# Patient Record
Sex: Female | Born: 1980 | Hispanic: Yes | Marital: Married | Smoking: Never smoker
Health system: Southern US, Community
[De-identification: ages and names within clinical notes are randomized; demographics above are authoritative.]

## PROBLEM LIST (undated history)

## (undated) ENCOUNTER — Inpatient Hospital Stay (HOSPITAL_COMMUNITY): Payer: Self-pay

## (undated) DIAGNOSIS — Z6791 Unspecified blood type, Rh negative: Secondary | ICD-10-CM

## (undated) DIAGNOSIS — T8040XA Rh incompatibility reaction due to transfusion of blood or blood products, unspecified, initial encounter: Secondary | ICD-10-CM

## (undated) DIAGNOSIS — Z3182 Encounter for Rh incompatibility status: Secondary | ICD-10-CM

## (undated) DIAGNOSIS — IMO0002 Reserved for concepts with insufficient information to code with codable children: Secondary | ICD-10-CM

## (undated) DIAGNOSIS — K116 Mucocele of salivary gland: Secondary | ICD-10-CM

## (undated) DIAGNOSIS — R87619 Unspecified abnormal cytological findings in specimens from cervix uteri: Secondary | ICD-10-CM

## (undated) DIAGNOSIS — O24410 Gestational diabetes mellitus in pregnancy, diet controlled: Secondary | ICD-10-CM

## (undated) HISTORY — DX: Unspecified abnormal cytological findings in specimens from cervix uteri: R87.619

## (undated) HISTORY — DX: Rh incompatibility reaction due to transfusion of blood or blood products, unspecified, initial encounter: T80.40XA

## (undated) HISTORY — DX: Reserved for concepts with insufficient information to code with codable children: IMO0002

## (undated) HISTORY — DX: Unspecified blood type, rh negative: Z67.91

## (undated) HISTORY — DX: Encounter for Rh incompatibility status: Z31.82

## (undated) HISTORY — DX: Gestational diabetes mellitus in pregnancy, diet controlled: O24.410

---

## 2006-01-22 ENCOUNTER — Inpatient Hospital Stay (HOSPITAL_COMMUNITY): Admission: AD | Admit: 2006-01-22 | Discharge: 2006-01-23 | Payer: Self-pay | Admitting: Family Medicine

## 2006-01-25 ENCOUNTER — Ambulatory Visit: Admission: AD | Admit: 2006-01-25 | Discharge: 2006-01-25 | Payer: Self-pay | Admitting: Obstetrics and Gynecology

## 2006-02-21 ENCOUNTER — Ambulatory Visit: Payer: Self-pay | Admitting: Obstetrics & Gynecology

## 2007-03-13 ENCOUNTER — Inpatient Hospital Stay (HOSPITAL_COMMUNITY): Admission: AD | Admit: 2007-03-13 | Discharge: 2007-03-13 | Payer: Self-pay | Admitting: Obstetrics

## 2007-05-26 ENCOUNTER — Inpatient Hospital Stay (HOSPITAL_COMMUNITY): Admission: RE | Admit: 2007-05-26 | Discharge: 2007-05-29 | Payer: Self-pay | Admitting: Obstetrics

## 2007-05-27 ENCOUNTER — Encounter (INDEPENDENT_AMBULATORY_CARE_PROVIDER_SITE_OTHER): Payer: Self-pay | Admitting: Obstetrics

## 2010-01-12 ENCOUNTER — Inpatient Hospital Stay (HOSPITAL_COMMUNITY): Admission: AD | Admit: 2010-01-12 | Discharge: 2010-01-12 | Payer: Self-pay | Admitting: Obstetrics

## 2010-01-30 ENCOUNTER — Ambulatory Visit: Payer: Self-pay | Admitting: Family Medicine

## 2010-01-30 DIAGNOSIS — O039 Complete or unspecified spontaneous abortion without complication: Secondary | ICD-10-CM | POA: Insufficient documentation

## 2010-01-30 DIAGNOSIS — Z6791 Unspecified blood type, Rh negative: Secondary | ICD-10-CM

## 2010-01-30 DIAGNOSIS — O99810 Abnormal glucose complicating pregnancy: Secondary | ICD-10-CM

## 2010-01-30 DIAGNOSIS — O0289 Other abnormal products of conception: Secondary | ICD-10-CM

## 2010-01-30 DIAGNOSIS — O26899 Other specified pregnancy related conditions, unspecified trimester: Secondary | ICD-10-CM

## 2010-01-30 LAB — CONVERTED CEMR LAB: Hgb A1c MFr Bld: 5.5 %

## 2010-01-31 LAB — CONVERTED CEMR LAB
MCHC: 33.4 g/dL (ref 30.0–36.0)
Platelets: 284 10*3/uL (ref 150–400)
RDW: 13.1 % (ref 11.5–15.5)
hCG, Beta Chain, Quant, S: 8.1 milliintl units/mL

## 2010-02-17 ENCOUNTER — Telehealth: Payer: Self-pay | Admitting: Family Medicine

## 2010-02-20 ENCOUNTER — Ambulatory Visit: Payer: Self-pay | Admitting: Family Medicine

## 2010-02-20 DIAGNOSIS — K116 Mucocele of salivary gland: Secondary | ICD-10-CM | POA: Insufficient documentation

## 2010-02-27 ENCOUNTER — Ambulatory Visit: Payer: Self-pay | Admitting: Family Medicine

## 2010-05-21 NOTE — L&D Delivery Note (Signed)
Delivery Note At 10:03 2AM a viable and healthy female was delivered via Vaginal, Spontaneous Delivery (Presentation: Right Occiput Anterior).  APGAR: 9, 9; weight 7 lb 4.4 oz (3300 g).   Placenta status: Intact, Spontaneous.  Cord: 3 vessels with the following complications: None.   Anesthesia: None  Episiotomy: None Lacerations: 2nd degree;Perineal Suture Repair: 3.0 vicryl Est. Blood Loss (mL): 300  Mom to postpartum.  Baby to nursery-stable.  Aayat Hajjar 12/24/2010, 10:45 AM

## 2010-05-29 ENCOUNTER — Encounter: Payer: Self-pay | Admitting: Family Medicine

## 2010-05-29 ENCOUNTER — Ambulatory Visit
Admission: RE | Admit: 2010-05-29 | Discharge: 2010-05-29 | Payer: Self-pay | Source: Home / Self Care | Attending: Family Medicine | Admitting: Family Medicine

## 2010-05-29 LAB — CONVERTED CEMR LAB
Beta hcg, urine, semiquantitative: POSITIVE
hCG, Beta Chain, Quant, S: 150696.2 milliintl units/mL

## 2010-05-31 ENCOUNTER — Encounter: Payer: Self-pay | Admitting: Family Medicine

## 2010-05-31 ENCOUNTER — Ambulatory Visit: Admission: RE | Admit: 2010-05-31 | Discharge: 2010-05-31 | Payer: Self-pay | Source: Home / Self Care

## 2010-06-01 ENCOUNTER — Telehealth: Payer: Self-pay | Admitting: Family Medicine

## 2010-06-06 ENCOUNTER — Encounter: Payer: Self-pay | Admitting: Family Medicine

## 2010-06-06 ENCOUNTER — Ambulatory Visit (HOSPITAL_COMMUNITY)
Admission: RE | Admit: 2010-06-06 | Discharge: 2010-06-06 | Payer: Self-pay | Source: Home / Self Care | Attending: Family Medicine | Admitting: Family Medicine

## 2010-06-12 ENCOUNTER — Ambulatory Visit: Admit: 2010-06-12 | Payer: Self-pay

## 2010-06-12 ENCOUNTER — Telehealth: Payer: Self-pay | Admitting: Family Medicine

## 2010-06-20 NOTE — Assessment & Plan Note (Signed)
Summary: fu for cirg/ mj   Vital Signs:  Patient profile:   30 year old female Weight:      140 pounds Pulse rate:   78 / minute BP sitting:   130 / 80  (right arm)  Vitals Entered By: Arlyss Repress CMA, (February 27, 2010 2:40 PM) CC: f/up last OV and flu shot Is Patient Diabetic? No Pain Assessment Patient in pain? no        Primary Care Atavia Poppe:  Zachery Dauer MD  CC:  f/up last OV and flu shot.  History of Present Illness: 1. F/U lip cyst:  Pt returns to clinic today to follow up on her lip cyst that was incised last week.  She reports that it is doing much better.  She doesn't even notice the bump anymore.  It hasn't been bleeding.  It is not painful.  She is able to eat and drink without difficulty.  ROS: denies fevers  Habits & Providers  Alcohol-Tobacco-Diet     Tobacco Status: never  Current Medications (verified): 1)  Gnp Prenatal Vitamins 28-0.8 Mg Tabs (Prenatal Vit-Fe Fumarate-Fa) .... One By Mouth Daily  Allergies: No Known Drug Allergies  Physical Exam  General:  vitals reviewed. no acute distress Mouth:  mucocele cyst on the lower right part of the lip s/p incision is healing well.  No surrounding area of erythema.  No bleeding.  Not painful.   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  Healing appropriately.  No further follow up needed.  Orders: [facility]- Est Level  2 (76734)  Complete Medication List: 1)  Gnp Prenatal Vitamins 28-0.8 Mg Tabs (Prenatal vit-fe fumarate-fa) .... One by mouth daily  Other Orders: Influenza Vaccine NON MCR ([id number])   Influenza Vaccine    Vaccine Type: Fluvax Non-MCR    Site: right deltoid    Mfr: GlaxoSmithKline    Dose: 0.5 ml    Route: IM    Given by: Arlyss Repress CMA,    Exp. Date: 11/15/2010    Lot #: [id number]    VIS given: 12/13/09 version given February 27, 2010.  Flu Vaccine Consent Questions    Do you have a history of  severe allergic reactions to this vaccine? no    Any prior history of allergic reactions to egg and/or gelatin? no    Do you have a sensitivity to the preservative Thimersol? no    Do you have a past history of Guillan-Barre Syndrome? no    Do you currently have an acute febrile illness? no    Have you ever had a severe reaction to latex? no    Vaccine information given and explained to patient? yes    Are you currently pregnant? no

## 2010-06-20 NOTE — Miscellaneous (Signed)
Summary: ROI  ROI   Imported By: Clydell Hakim 02/01/2010 16:03:47  _____________________________________________________________________  External Attachment:    Type:   Image     Comment:   External Document

## 2010-06-20 NOTE — Assessment & Plan Note (Signed)
Summary: ball under her lip/mj   Vital Signs:  Patient profile:   30 year old female Height:      62.75 inches Weight:      142 pounds BMI:     25.45 Temp:     98.4 degrees F oral Pulse rate:   74 / minute BP sitting:   123 / 83  (right arm) Cuff size:   regular  Vitals Entered By: Tessie Fass CMA (February 20, 2010 4:44 PM)  Primary Care Provider:  Zachery Dauer MD   History of Present Illness: 1. Bump on her lip:  Pt presents with a bump on her lower lip on the inside of her lip.  It has been there for about 5 months.  It is about 1 cm in diameter.  It is not really getting bigger but is now more bothersome to her.  It does bother her when she is eating.  It has not been draining or bleeding.  ROS: denies fevers   Procedure note: -Informed consent was obtained after explaining the risks and benefits - Area was numbed with Lidocaine with Epi - Cyst was excised with 15 blade - Monsel's solution was applied for bleeding - Liquid Nitrogen was applied x2 with a 2mm freeze area extension - Pt tolerated the procedure well - Minimal blood loss - No complications  Allergies: No Known Drug Allergies  Physical Exam  General:  vitals reviewed. no acute distress Mouth:  1 cm mucocele cyst on the lower right part of the lip.   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    Orders: Provider Misc Charge- [facility] (Misc)  Complete Medication List: 1)  Gnp Prenatal Vitamins 28-0.8 Mg Tabs (Prenatal vit-fe fumarate-fa) .... One by mouth daily  Patient Instructions: 1)  Si esta  sangrando, aplique presion directamente en el labio. 2)  Coma comida blando y no picante. 3)  Va a tener hinchason del labio. Si se pone mas doloroso venga antes a la clinica.  4)  Despues de pocos dias, la costra va a caer y el labio va a sanar de adentro hacia afuero.  5)   Regrese en una semana para chequeo.  6)  Return in one week.   Procedure Note  Cyst Removal: The patient complains of irritation and swelling. Onset of lesion: 3 months Indication: inflamed lesion Consent signed: yes  Procedure #1: elliptical incision and removal w/blunt dissection    Size (in cm): 0.6 x 0.6    Region: inferior    Location: lip    Comment: base of mucocele frozen with liquid nitrogen after application of Monsel's solution    Instrument used: #15 blade    Anesthesia: 2.0 ml 1% lidocaine w/epinephrine

## 2010-06-20 NOTE — Miscellaneous (Signed)
Summary: Procedures consent  Procedures consent   Imported By: De Nurse 02/23/2010 16:22:22  _____________________________________________________________________  External Attachment:    Type:   Image     Comment:   External Document

## 2010-06-20 NOTE — Progress Notes (Signed)
Summary: phnmsg  Phone Note Call from Patient Call back at [phone]   Caller: Patient Reason for Call: Privacy/Consent Authorization Summary of Call: wants to know if her husband can become NP Roxan Hockey Initial call taken by: De Nurse,  February 17, 2010 1:32 PM  Follow-up for Phone Call        Yes, he's an immediate family member Follow-up by: Zachery Dauer MD,  February 17, 2010 5:17 PM

## 2010-06-20 NOTE — Assessment & Plan Note (Signed)
Summary: NEW PT/[facility]   Vital Signs:  Patient profile:   30 year old female Height:      62.75 inches Weight:      139.2 pounds BMI:     24.94 Temp:     98.7 degrees F oral Pulse rate:   77 / minute Pulse rhythm:   regular BP sitting:   109 / 75  (left arm) Cuff size:   regular  Vitals Entered By: Loralee Pacas CMA (January 30, 2010 2:09 PM) CC: NP   Primary Care Provider:  Zachery Dauer MD  CC:  NP.  History of Present Illness: 30 yo [id number] here for NP visit. Interpretor used.  1. Blighted Ovum: Reviewed Echart and discussed at length with patient. 01/12/10, patient found to have blighted ovum at Dr. Elsie Stain (OB/GYN) office. She was Rx Misoprostol and Percocet. She was also instructed to go to the [facility] for Rx Rophylac as patient is known RH negative - documented as given. The patient states that she had contractions and then "a lot, a lot" of pain and passage of blood/tissue about 6 hours later, associated with a feeling of "almost passing out." She has not had a normal menses since then, but does endorses "spotting" - last time 2 days ago - brownish discharge. She denies fever/chills, N/V/D, HA, dizziness, abdominal pain, vaginal pain, vaginal itch/odor, dysuria, fatigue. She has not been sexually active with her husband since the episode. She was told to f/u with Dr. Gaynell Face for evaluation first. She decided to come here first. She does want to concieve in the near future.  Habits & Providers  Alcohol-Tobacco-Diet     Tobacco Status: never  Current Medications (verified): 1)  Gnp Prenatal Vitamins 28-0.8 Mg Tabs (Prenatal Vit-Fe Fumarate-Fa) .... One By Mouth Daily  Allergies (verified): No Known Drug Allergies  Past History:  Past Medical History: [id number]     - Hx of blighted ovum (01/12/10)    - Awaiting records from Dr. 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  Social History: Lives in a house in [place] with Husband (Roxan Hockey - DOB 03/16/80) and Son [facility] Vasquez-Leon - DOB 05/27/07). Not employed. Denies tobacco, ETOH, drugs. + exercise.Smoking Status:  never  Review of Systems General:  Denies chills, fatigue, and fever. GI:  Denies abdominal pain, diarrhea, nausea, and vomiting. GU:  Complains of abnormal vaginal bleeding and discharge; denies dysuria and genital sores. Derm:  Denies rash.  Physical Exam  General:  Well-developed, well-nourished, in no acute distress; alert, appropriate and cooperative throughout examination. Vitals reviewed. Lungs:  Normal respiratory effort, chest expands symmetrically. Lungs are clear to auscultation, no crackles or wheezes. Heart:  Normal rate and regular rhythm. S1 and S2 normal without gallop, murmur, click, rub or other extra sounds. Abdomen:  Bowel sounds positive,abdomen soft and non-tender without masses, organomegaly or hernias noted. Genitalia:  Pelvic Exam:        External: normal female genitalia without lesions or masses        Vagina: normal without lesions or masses        Cervix: dilated 1-2 cm, with brown mucus discharge, no signs of infection or inflammation Psych:  Oriented X3, memory intact for recent and remote, normally interactive, good eye contact, not anxious appearing, and not depressed appearing.  Impression & Recommendations:  Problem # 1:  OTHER ABNORMAL PRODUCT OF CONCEPTION (ICD-631) Assessment New Patient with recent (and endorsed previous episode of) blighted ovum. Hx is unclear. Will request records from Dr. Gaynell Face, OB/GYN.  Orders: CBC-[facility] (30865)  Problem # 2:  ABORTION, COMPLETE (HQI-696.29) Assessment: New s/p Misoprostol and Rhophylac 01/12/10. No s/s of infection or complications on H/P. Will check serum quant. Will also check CBC to  evaluate Hgb and WBC. Advised patient to continue to use condoms for contraception for  ~ 4-6 months as previously recommended by Dr. Gaynell Face. She does wish to try to concieve shortly after. Advised to continue PNV.  Orders: B-HCG Quant-[facility] ([id number])  Problem # 3:  GESTATIONAL DIABETES (ICD-648.80) Assessment: New Will previous pregnancy - diet controlled. Patient also with strong FamHx. Will evaluate with A1c today. Patient endorses maintaining a DM friendly diet. She is at a healthy weight. 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  Complete Medication List: 1)  Gnp Prenatal Vitamins 28-0.8 Mg Tabs (Prenatal vit-fe fumarate-fa) .... One by mouth daily  Patient Instructions: 1)  It was nice to meet you today.  2)  We will call you with the results of your lab tests.  Laboratory Results   Blood Tests   Date/Time Received: January 30, 2010 2:54 PM  Date/Time Reported: January 30, 2010 3:39 PM   HGBA1C: 5.5%   (Normal Range: Non-Diabetic - 3-6%   Control Diabetic - 6-8%)  Comments: ...........test performed by............Marland KitchenDewitt Hoes, [place](ASCP)3:39 PM entered by Terese Door, CMA       Prevention & Chronic Care Immunizations   Influenza vaccine: Not documented   Influenza vaccine deferral: Deferred  (01/30/2010)    Tetanus booster: Not documented    Pneumococcal vaccine: Not documented  Other Screening   Pap smear: Not documented   Pap smear action/deferral: Not indicated-other  (01/30/2010)   Pap smear due: 07/20/2010   Smoking status: never  (01/30/2010)

## 2010-06-22 NOTE — Assessment & Plan Note (Signed)
Summary: F/U BOIL TO LIP/KEEPS COMING BACK/[facility]   Vital Signs:  Patient profile:   30 year old female Height:      62.75 inches Weight:      144 pounds BMI:     25.81 Temp:     98.5 degrees F oral Pulse rate:   80 / minute BP sitting:   121 / 80  (right arm)  Vitals Entered By: Arlyss Repress CMA, (May 29, 2010 1:42 PM) CC: f/up lip cyst; also wants pregnancy test Is Patient Diabetic? No Pain Assessment Patient in pain? no        Primary Care Provider:  Zachery Dauer MD  CC:  f/up lip cyst; also wants pregnancy test.  History of Present Illness: Conducted with help of interpreter   1) Mucocele: History of mucocele treated in October 2011 with/ excision. Returned about 2 weeks ago. Comes and goes. Irritating. Worse when eats meat. Sometimes drains small amount of clear fluid.   2) Pregnancy?: LMP 03/22/10. Positive pregnancy test (urine) here. Now [id number] at 9 weeks 5 days based on LMP. Reports breast tenderness, nausea in the morning. Patient w/ blighted ovum at Dr. Elsie Stain (OB/GYN) office in July 2011. She was Rx Misoprostol and Percocet. She was also instructed to go to the [facility] for Rx Rophylac as patient is known RH negative - documented as given.   Denies fever/chills, N/V/D, HA, dizziness, abdominal pain, vaginal pain, vaginal itch/odor, dysuria, fatigue, contractions, bleeding.   Habits & Providers  Alcohol-Tobacco-Diet     Tobacco Status: never  Current Medications (verified): 1)  Gnp Prenatal Vitamins 28-0.8 Mg Tabs (Prenatal Vit-Fe Fumarate-Fa) .... One By Mouth Daily  Allergies (verified): No Known Drug Allergies  Physical Exam  General:  vitals reviewed. no acute distress Mouth:  Slightly raised mucocele cyst right lower lip.  No surrounding area of erythema.  No bleeding.  Not painful. Lungs:  Normal respiratory effort, chest expands symmetrically. Lungs are clear to auscultation, no crackles or wheezes. Heart:  Normal rate and regular rhythm. S1 and S2  normal without gallop, murmur, click, rub or other extra sounds. Abdomen:  unable to Doppler fetal heart tones    Impression & Recommendations:  Problem # 1:  PREGNANCY WITH HISTORY OF TROPHOBLASTIC DISEASE (ICD-V23.1) Assessment New History of blighted ovum. Will check serum Hcg today and two days from now - would expect doubling at this point in pregnancy. Unable to hear Doppler tones today, however patient only 9 w 5 d by LMP. If normal IUP would need to set up as new OB visit - would need proof of pregnancy letter and need to be set up with [facility] prior to scheduling for new OB visit and new OB labs and ultrasound. If blighted ovum would need to discuss potential plans for management (including expectant management vs medical termination vs D+C) with patient at that time.    Orders: B-HCG Quant-[facility] ([id number]) [facility]- Est  Level 4 (99214)Future Orders: B-HCG Quant-[facility] ([id number]) ... 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 Will have patient follow up if needs further treatment. Instructions for aftercare given. Orders: Cryo (1st lesion) benign - [facility] (17000)  Complete Medication List: 1)  Gnp Prenatal Vitamins 28-0.8 Mg Tabs (Prenatal vit-fe fumarate-fa) .... One by mouth daily  Other Orders: U Preg-[facility] ([id number])  Patient Instructions: 1)  [employer]  por la tarde) para chequear la prueba de sangre de [place]. 2)  Toma las pastillas prenatales cada dia    Orders Added: 1)  U Preg-[facility] [[id number]] 2)  B-HCG Quant-[facility] [[id number]] 3)  B-HCG Quant-[facility] [[id number]] 4)  [facility]- Est  Level 4 [99214] 5)  Cryo (1st lesion) benign - [facility] [[id number]]    Laboratory Results   Urine Tests  Date/Time Received: May 29, 2010 2:02 PM  Date/Time Reported: May 29, 2010 2:06 PM     Urine HCG:  positive Comments: ...............test performed by......Marland KitchenBonnie A. [place], MLS (ASCP)cm       Procedure Note  Cyst Removal: Onset of lesion: 2 weeks ago  Indication: inflamed lesion  Procedure #1: Cryotherapy w/ liquid nitrogen    Size (in cm): 0.7 x 0.5    Region: lower right     Location: lip     Comment: mucocele

## 2010-06-22 NOTE — Progress Notes (Signed)
Summary: lab  Phone Note Call from Patient   Caller: Patient Summary of Call: Pt called and ask for lab result. Pt wants dr.Anely Spiewak call her back and check with pt if every thing is ok. Initial call taken by: Marines Jean Rosenthal,  June 01, 2010 12:18 PM  Follow-up for Phone Call        Reviewed labs with Dr. Mauricio Po - beta hCG levels decreased over two day interval - patient needs to have transvaginal ultrasound to assess that she has a viable intrauterine pregnancy - order placed. Please call patient to let her know this.  Follow-up by: Bobby Rumpf  MD,  June 02, 2010 8:33 AM    I called pt and let her know HCG levels dicreased and pt has [place] appt at [facility] on 06/06/10 @ 11:00am  [employer]

## 2010-06-22 NOTE — Miscellaneous (Signed)
Summary: patient requests results of ultrasound  Clinical Lists Changes patient came by office wanting to get results of ultrasound. advised we will send message to Dr. Wallene Huh and will call her back. cell # [phone] Theresia Lo RN  June 06, 2010 1:58 PM  Reviewed u/s - shows single IUP at 10 weeks 1 day. Relayed message to RN to call patient with results.  Bobby Rumpf  MD  June 06, 2010 2:02 PM    Marines called patient today and she has questions about ultrasound still. appointment scheduled with Dr. [facility]  06/12/2010  for patient to discuss. Theresia Lo RN  June 07, 2010 2:31 PM     I called patient back; call completed in Spanish.  Call to cell [phone], no answer.  Left message. Called and spoke with her on [phone] and spoke with patient. She is concerned because she had gotten a call that her blood tests were abnormal.  I discussed with her the reason for our concern about the hCG levels, reassurance about the findings of the [place] which shows viable IUP just beyond 10 weeks' gestation.  Patient is to keep her appt with PCP scheduled for next week.  She reports feeling well, no active concerns, no additional questions.  Paula Compton MD  June 09, 2010 2:33 PM

## 2010-06-22 NOTE — Progress Notes (Signed)
  Phone Note Outgoing Call   Call placed by: Paula Compton MD,  June 12, 2010 1:56 PM Call placed to: Patient Summary of Call: [phone] reached patient.  Conversation in [place].  She is told not to come in to appt scheduled for today, as it was simply to communicate the results of the OB US that shows viable IUP.  She agrees and voices understanding.  Initial call taken by: Paula Compton MD,  June 12, 2010 2:01 PM

## 2010-06-27 ENCOUNTER — Encounter: Payer: Self-pay | Admitting: Family Medicine

## 2010-06-28 ENCOUNTER — Other Ambulatory Visit: Payer: Self-pay

## 2010-06-28 ENCOUNTER — Other Ambulatory Visit: Payer: Self-pay | Admitting: Family Medicine

## 2010-06-28 DIAGNOSIS — Z3480 Encounter for supervision of other normal pregnancy, unspecified trimester: Secondary | ICD-10-CM

## 2010-06-29 LAB — SICKLE CELL SCREEN: Sickle Cell Screen: NEGATIVE

## 2010-06-29 LAB — HIV ANTIBODY (ROUTINE TESTING W REFLEX): HIV: NONREACTIVE

## 2010-06-30 LAB — OBSTETRIC PANEL
Basophils Relative: 0 % (ref 0–1)
Eosinophils Absolute: 0 10*3/uL (ref 0.0–0.7)
Eosinophils Relative: 1 % (ref 0–5)
Hepatitis B Surface Ag: NEGATIVE
Lymphs Abs: 1.8 10*3/uL (ref 0.7–4.0)
MCH: 28.7 pg (ref 26.0–34.0)
MCHC: 33.4 g/dL (ref 30.0–36.0)
MCV: 86 fL (ref 78.0–100.0)
Neutrophils Relative %: 71 % (ref 43–77)
Platelets: 256 10*3/uL (ref 150–400)
RBC: 4.49 MIL/uL (ref 3.87–5.11)
Rh Type: NEGATIVE

## 2010-06-30 LAB — URINE CULTURE: Colony Count: NO GROWTH

## 2010-07-05 ENCOUNTER — Ambulatory Visit (INDEPENDENT_AMBULATORY_CARE_PROVIDER_SITE_OTHER): Payer: Self-pay | Admitting: Family Medicine

## 2010-07-05 ENCOUNTER — Other Ambulatory Visit: Payer: Self-pay | Admitting: Family Medicine

## 2010-07-05 ENCOUNTER — Encounter: Payer: Self-pay | Admitting: Family Medicine

## 2010-07-05 DIAGNOSIS — IMO0002 Reserved for concepts with insufficient information to code with codable children: Secondary | ICD-10-CM

## 2010-07-05 DIAGNOSIS — O24419 Gestational diabetes mellitus in pregnancy, unspecified control: Secondary | ICD-10-CM

## 2010-07-05 DIAGNOSIS — N898 Other specified noninflammatory disorders of vagina: Secondary | ICD-10-CM

## 2010-07-05 DIAGNOSIS — Z331 Pregnant state, incidental: Secondary | ICD-10-CM

## 2010-07-05 DIAGNOSIS — O98919 Unspecified maternal infectious and parasitic disease complicating pregnancy, unspecified trimester: Secondary | ICD-10-CM

## 2010-07-05 DIAGNOSIS — O99810 Abnormal glucose complicating pregnancy: Secondary | ICD-10-CM

## 2010-07-05 LAB — POCT WET PREP (WET MOUNT)
Clue Cells Wet Prep HPF POC: NEGATIVE
Trichomonas Wet Prep HPF POC: NEGATIVE
Yeast Wet Prep HPF POC: NEGATIVE

## 2010-07-05 NOTE — Progress Notes (Deleted)
  Subjective:    Patient ID: Maria [name], female    DOB: [birth year], 30 y.o.   MRN: [id number]  HPI    Review of Systems     Objective:   Physical Exam  Constitutional: She appears well-developed and well-nourished.          Assessment & Plan:

## 2010-07-05 NOTE — Progress Notes (Deleted)
  Subjective:    Maria [name] is being seen today for her first obstetrical visit.  This is a planned pregnancy. She is at [redacted]w[redacted]d gestation. Her obstetrical history is significant for history of diet controlled gestational diabetes and RH negativity s/p Rhogam. Relationship with FOB: spouse, living together. Patient does intend to breast feed. Pregnancy history fully reviewed. Pt reports history of 1st trimester loss x 2 in the past(See OB history for full details). Pt states that she was given rhogam for treatment.    Patient reports no complaints.    Objective:     BP 120/70  Temp 98.1 F (36.7 C)  Wt 140 lb 11.2 oz (63.821 kg)  LMP 03/22/2010  Breastfeeding? Unknown Physical Exam  Constitutional: She is oriented to person, place, and time. She appears well-developed and well-nourished.  HENT:  Head: Normocephalic and atraumatic.  Eyes: Conjunctivae and EOM are normal. Pupils are equal, round, and reactive to light.  Neck: Normal range of motion. Neck supple.  Cardiovascular: Normal rate and regular rhythm.   Respiratory: Effort normal and breath sounds normal.  GI: Soft. Bowel sounds are normal.       Fetal heart rate in the 150s  Genitourinary: Vagina normal. No breast swelling or discharge. No erythema around the vagina.       Normal external genitalia Introitis normal Vaginal mucosa normal  Mild discharge  Musculoskeletal: Normal range of motion.  Neurological: She is alert and oriented to person, place, and time.  Skin: Skin is warm.  Psychiatric: She has a normal mood and affect. Her behavior is normal.    Maternal Exam:  Abdomen: Patient reports no abdominal tenderness. Surgical scars: low transverse.   Introitus: Normal vulva. Normal vagina.       Assessment:    Pregnancy: [id number] Patient Active Problem List  Diagnoses  . MUCOCELE, SALIVARY GLAND  . OTHER ABNORMAL PRODUCT OF CONCEPTION  . ABORTION, COMPLETE  . GESTATIONAL DIABETES  . RH FACTOR, NEGATIVE        Plan:     Initial labs drawn prior to visit.  Prenatal vitamins Early 1 hr gtt today--> 1 hr GTT 253; Will refer to high risk OB for management of gestational DM GC/Chl today  OB urine culture today Will re-attempt to get records from Dr. Elsie Stain office in terms of complete OB/Gyn/medical history.  Problem list reviewed and updated. AFP3 discussed: declined. Role of ultrasound in pregnancy discussed; fetal survey: requested. Amniocentesis discussed: declined. Follow up in 4 weeks.   NEWTON,STEVEN 07/05/2010

## 2010-07-05 NOTE — Progress Notes (Signed)
  Subjective:    Patient ID: Maria [name], female    DOB: [birth year], 30 y.o.   MRN: [id number]  HPI    Review of Systems     Objective:   Physical Exam        Assessment & Plan:

## 2010-07-05 NOTE — Patient Instructions (Addendum)
Embarazo - Segundo trimestre (Pregnancy - Second Trimester)   El segundo trimestre del Psychiatrist (del 3 al ) es un perodo de evolucin rpida para usted y el beb. Hacia el final del sexto mes, el beb mide aproximadamente 23 cm y pesa 680 g. Comenzar a Pharmacologist del beb [place] 18 y las 20 [address] de [place]. Podr sentir las pataditas ("quickening en ingls"). Hay un rpido [employer]. Puede segregar un lquido claro Charity fundraiser) de las [place]. Quizs sienta pequeas contracciones en el vientre (tero) Esto se conoce  como falso trabajo de parto o contracciones de Braxton-Hicks. Es como una prctica del trabajo de parto que se produce cuando el beb est listo para salir. Generalmente los problemas de vmitos matinales ya se han superado hacia el final del Medical sales representative. Algunas mujeres desarrollan pequeas manchas oscuras (que se denominan cloasma, mscara del embarazo) en la cara que normalmente se van luego del nacimiento del beb. La exposicin al sol empeora las manchas. Puede desarrollarse acn en algunas mujeres embarazadas, y puede desaparecer en aquellas que ya tienen acn.   EXAMENES PRENATALES  Durante los [employer], deber seguir realizando pruebas de [place], segn avance el [place]. Estas pruebas se realizan para controlar su salud y la del beb. Tambin se realizan anlisis de sangre para [employer] niveles de [place]. La anemia (bajo nivel de hemoglobina) es frecuente durante el embarazo. Para prevenirla, se administran hierro y vitaminas. Tambin se le realizarn exmenes para saber si tiene diabetes entre las 24 y las 28 semanas del [place]. Podrn repetirle algunas de las [employer] hicieron previamente.  En cada visita le medirn el tamao del tero. Esto se realiza para asegurarse de que el beb est creciendo correctamente de acuerdo al estado del [place].   Tambin en cada visita prenatal controlarn su presin arterial. Esto se  realiza para asegurarse de que no tenga toxemia.  Se controlar su orina para asegurarse de que no tenga infecciones, diabetes o protena en la orina.  Se controlar su peso regularmente para asegurarse que el aumento ocurre al ritmo indicado. Esto se hace para asegurarse que usted y el beb tienen una evolucin normal.  En algunas ocasiones se realiza una prueba de ultrasonido para confirmar el correcto desarrollo y evolucin del beb. Esta prueba se realiza con ondas sonoras inofensivas para el beb, de modo que el profesional pueda calcular ms precisamente la fecha del [place].   Algunas veces se realizan pruebas especializadas del lquido amnitico que rodea al beb. Esta prueba se denomina amniocentesis. El lquido amnitico se obtiene introduciendo una aguja en el vientre (abdomen). Se realiza para Conservator, museum/gallery en los que existe alguna preocupacin acerca de algn problema gentico que pueda sufrir el beb. En ocasiones se lleva a cabo cerca del final del embarazo, si es necesario inducir al [employer]. En este caso se realiza para asegurarse que los pulmones del beb estn lo suficientemente maduros como para que pueda vivir fuera del tero.   CAMBIOS QUE OCURREN EN EL SEGUNDO TRIMESTRE DEL EMBARAZO Su organismo atravesar numerosos cambios durante el [employer].  Estos pueden variar de Neomia Dear persona a otra. Converse con el profesional que la asiste acerca los cambios que usted note y que la preocupen.  Durante el segundo trimestre probablemente sienta un aumento del apetito. Es normal tener "antojos" de Development worker, community. Esto vara de Neomia Dear persona a otra y de un embarazo a Therapist, art.   El abdomen inferior comenzar a abultarse.  Podr tener la necesidad de Geographical information systems officer con ms frecuencia debido a que el tero y el beb presionan sobre la vejiga. Tambin es frecuente contraer ms infecciones urinarias durante el embarazo (dolor al [employer]oco[employer]illips). Puede evitarlas bebiendo gran cantidad de  lquidos y vaciando la vejiga antes y despus de Sales promotion account executive.   Podrn aparecer las primeras estras en las caderas, abdomen y [place]. Estos son cambios normales del cuerpo durante el [place]. No existen medicamentos ni ejercicios que puedan prevenir [employer].  Es posible que comience a desarrollar venas inflamadas y abultadas (varices) en las piernas. El uso de medias de descanso, Optometrist sus pies durante 15 minutos, 3 a 4 veces al da y Film/video editor la sal en su dieta ayuda a Journalist, newspaper.  Podr sentir Engineering geologist gstrica a medida que el tero crece y Doctor, general practice. Puede tomar anticidos, con la autorizacin de su mdico, para Financial planner. Tambin es til ingerir pequeas comidas 4 a 5 veces al Futures trader.  La constipacin puede tratarse con un laxante o agregando fibra a su dieta. Beber grandes cantidades de lquidos, comer vegetales, frutas y granos integrales es de [place].  Tambin es beneficioso practicar actividad fsica. Si ha sido una persona Engineer, mining, podr continuar con la [employer]son de las actividades durante el mismo. Si ha sido [employer], puede ser beneficioso que comience con un programa de ejercicios, Museum/gallery exhibitions officer.  Puede desarrollar hemorroides (vrices en el recto) hacia el final del segundo trimestre. Tomar baos de asiento tibios y Chemical engineer cremas recomendadas por el profesional que lo asiste sern de ayuda para los problemas de hemorroides.  Tambin podr Financial risk analyst de espalda durante este momento de su embarazo. Evite levantar objetos pesados, utilice zapatos de taco bajo y [place] buena postura para ayudar a reducir los problemas de [place].  Algunas mujeres embarazadas desarrollan hormigueo y adormecimiento de la mano y los dedos debido a la hinchazn y compresin de los ligamentos de la mueca (sndrome del tnel carpiano). Esto desaparece una vez que el beb nace.  Como sus pechos se agrandan,  Pension scheme manager un sujetador ms grande. Use un sostn de soporte, cmodo y de algodn. No utilice un sostn para amamantar hasta el ltimo mes de embarazo si va a amamantar al beb.  Podr observar una lnea oscura desde el ombligo hacia la zona pbica denominada linea nigra.  Podr observar que sus mejillas se ponen coloradas debido al aumento de flujo sanguneo en la cara.  Podr desarrollar "araitas" en la cara, cuello y pecho. Esto desaparece una vez que el beb nace.   INSTRUCCIONES PARA EL CUIDADO DOMICILIARIO  Es extremadamente importante que evite el cigarrillo, hierbas medicinales, alcohol y las drogas no prescriptas durante el Psychiatrist. Estas sustancias qumicas afectan la formacin y el desarrollo del beb. Evite estas sustancias durante todo el embarazo para asegurar el nacimiento de un beb sano.  La mayor parte de los cuidados que se aconsejan son los mismos que los indicados para Financial risk analyst trimestre del Psychiatrist. Cumpla con las citas tal como se le indic. Siga las instrucciones del profesional que lo asiste con respecto al uso de los medicamentos, el ejercicio y Psychologist, forensic.  Durante el embarazo debe obtener nutrientes para usted y para su beb. Consuma alimentos balanceados a intervalos regulares. Elija alimentos como carne, pescado, [place] y otros productos lcteos descremados, vegetales, frutas, panes integrales y cereales. El Equities trader cul es el aumento de peso ideal.  Las relaciones sexuales  fsicas pueden continuarse hasta cerca del fin del embarazo si no existen otros problemas. Estos problemas pueden ser la prdida temprana (prematura) de lquido amnitico de las [place], sangrado vaginal, dolor abdominal u otros problemas mdicos o del Psychiatrist.  Realice Tes[employer], si no tiene restricciones. Consulte con el profesional que la asiste si no sabe con certeza si determinados ejercicios son seguros. El mayor aumento de peso tiene Environmental consultant durante  los ltimos 2 trimestres del Psychiatrist. El ejercicio la ayudar a: l Controlar su peso.  l Ponerla en forma para el parto.  l Ayudarla a perder peso luego de haber dado a luz.  Use un buen sostn o como los que se usan para hacer deportes para Paramedic la sensibilidad de las [place]. Tambin puede serle til si lo [place] mientras duerme. Si pierde Product manager, podr [employer]in.  No utilice la baera con agua caliente, baos turcos y saunas durante el [address]  Utilice el cinturn de seguridad sin excepcin cuando conduzca. Este la proteger a usted y al beb en caso de accidente.  Evite comer carne cruda, queso crudo, y el contacto con los utensilios y desperdicios de los gatos. Estos elementos contienen grmenes que pueden causar defectos de nacimiento en el beb.  El segundo trimestre es un buen momento para visitar a su dentista y Software engineer si an no lo ha hecho. Es Primary school teacher los dientes limpios. Utilice un cepillo de dientes blando. Cepllese ms suavemente durante el embarazo.  Es ms fcil perder algo de orina durante el [place]. Apretar y Chief Operating Officer los msculos de la pelvis la ayudar con este problema. Practique detener la miccin cuando est en el bao. Estos son los mismos msculos que Development worker, international aid. Son [employer] mismos msculos que utiliza cuando trata de [employer] gases. Puede practicar apretando estos msculos 10 veces, y repetir esto 3 veces por da aproximadamente. Una vez que conozca qu msculos debe apretar, no realice estos ejercicios durante la miccin. Puede favorecerle una infeccin si la orina vuelve hacia atrs.  Pida ayuda si tiene necesidades econmicas, de asesoramiento o nutricionales durante el [place]. El profesional podr ayudarla con respecto a estas necesidades, o derivarla a otros especialistas.  La piel puede ponerse grasa. Si esto sucede, lvese la cara con un jabn [place], utilice un humectante no graso y  [place] con base de aceite o crema.   CONSUMO DE MEDICAMENTOS Y DROGAS DURANTE EL EMBARAZO  Contine tomando las vitaminas apropiadas para esta etapa tal como se le indic. Las vitaminas deben contener un miligramo de cido flico y deben suplementarse con hierro. Guarde todas las vitaminas fuera del alcance de los nios. La ingestin de slo un par de vitaminas o tabletas que contengan hierro puede ocasionar la [employer] en un beb o en un nio pequeo.  Evite el uso de [place], inclusive los de venta libre y hierbas que no hayan sido prescriptos o indicados por el profesional que la asiste. Algunos medicamentos pueden causar problemas fsicos al beb. Utilice los medicamentos de venta libre o de prescripcin para Chief Technology Officer, Environmental health practitioner o la [place], segn se lo indique el profesional que lo asiste. No utilice aspirina, ibuprofeno (Motrin, Advil, Nuprin) o naproxeno (Aleve) a menos que el profesional la autorice.  El consumo de alcohol est relacionado con ciertos defectos de nacimiento. Esto incluye el sndrome de alcoholismo fetal. Debe evitar el consumo de alcohol en cualquiera de sus formas. El cigarrillo causa nacimientos prematuros y bebs de  bajo peso. El uso de drogas recreativas est absolutamente prohibido. Son muy nocivas para el beb. Un beb que nace de [employer], ser adicto al nacer. Ese beb tendr los mismos sntomas de abstinencia que un adulto.  Infrmele al profesional si consume alguna droga.  No consuma drogas ilegales. Pueden causarle mucho dao al beb.   SOLICITE ATENCIN MDICA SI:  Tiene preguntas o preocupaciones durante su embarazo. Es mejor que llame para Science writer las dudas que esperar hasta su prxima visita prenatal. Thressa Sheller forma se sentir ms tranquila.    SOLICITE ATENCIN MDICA DE INMEDIATO SI:  La temperatura oral se eleva sin motivo por encima de 100F (37.9C) o segn le indique el profesional que lo asiste.  Tiene una prdida de lquido  por la vagina (canal de parto). Si sospecha una ruptura de las [place], tmese la temperatura y llame al profesional para informarlo sobre esto.  Observa unas pequeas manchas, una hemorragia vaginal o elimina cogulos. Notifique al profesional acerca de la cantidad y de cuntos apsitos est utilizando. Unas pequeas manchas de sangre son algo comn durante el Psychiatrist, especialmente despus de Sales promotion account executive.  Presenta un olor desagradable en la secrecin vaginal y observa un cambio en el color, de transparente a blanco.  Contina con las nuseas y no obtiene alivio de los remedios indicados. Vomita sangre o algo similar a la borra del caf.  [place] o sube ms de 900 g. en una semana, o segn lo indicado por el profesional que la asiste.   Observa que se le Southwest Airlines, las manos, los pies o las piernas.  Ha estado expuesta a la rubola y no ha sufrido la enfermedad.   Ha estado expuesta a la quinta enfermedad o a la varicela.  Presenta dolor abdominal. Las molestias en el ligamento redondo son Neomia Dear causa no cancerosa (benigna) frecuente de dolor abdominal durante el embarazo. El profesional que la asiste deber evaluarla.  Presenta dolor de cabeza intenso que no se [place].  Presenta fiebre, diarrea, dolor al orinar o le falta la respiracin.  Presenta dificultad para ver, visin borrosa, o visin doble.  Sufre una cada, un accidente de trnsito o cualquier tipo de trauma.  Vive en un hogar en el que existe violencia fsica o mental.  Document Released: 02/14/2005  Document Re-Released: 03/03/2009 [facility] Patient Information 2011 [place], [place].

## 2010-07-06 LAB — CULTURE, OB URINE: Colony Count: NO GROWTH

## 2010-07-06 NOTE — Assessment & Plan Note (Signed)
Summary: clinical update    Prevention & Chronic Care Immunizations   Influenza vaccine: Fluvax Non-MCR  (02/27/2010)   Influenza vaccine deferral: Deferred  (01/30/2010)    Tetanus booster: Not documented    Pneumococcal vaccine: Not documented  Other Screening   Pap smear: Not documented   Pap smear action/deferral: Not indicated-other  (01/30/2010)   Pap smear due: 07/20/2010   Smoking status: never  (05/29/2010)    Social History:    Lives in a house in [place] with Husband (Antonio Vasquez - DOB 03/16/80) and Son (Antonio Vasquez-Leon - DOB 05/27/07). Not employed. Denies tobacco, ETOH, drugs. + exercise.        Education 9-12 years

## 2010-07-08 LAB — HIV ANTIBODY (ROUTINE TESTING W REFLEX): HIV: NONREACTIVE

## 2010-07-12 ENCOUNTER — Telehealth: Payer: Self-pay | Admitting: *Deleted

## 2010-07-12 NOTE — Telephone Encounter (Signed)
Attempted to call patient to inform of [facility] appointment. Received fax from [facility] stating that patient's appointment is 07/17/2010 @ 9:45am.  If pt is unable to keep appointment call atleast 24 hours in advance [phone].Marland KitchenErma Pinto   Interpreter for patient called back and i gave her the information for the appointment. I also informed her that she should call adopt-a-mom to let them know about this appointment so that they can interpret for her.Logan Bores, Roselyn Meier

## 2010-07-17 ENCOUNTER — Encounter: Payer: Self-pay | Attending: Obstetrics & Gynecology | Admitting: Dietician

## 2010-07-17 ENCOUNTER — Other Ambulatory Visit: Payer: Self-pay

## 2010-07-17 ENCOUNTER — Telehealth: Payer: Self-pay | Admitting: Family Medicine

## 2010-07-17 DIAGNOSIS — O24919 Unspecified diabetes mellitus in pregnancy, unspecified trimester: Secondary | ICD-10-CM

## 2010-07-17 DIAGNOSIS — Z713 Dietary counseling and surveillance: Secondary | ICD-10-CM | POA: Insufficient documentation

## 2010-07-17 DIAGNOSIS — O99810 Abnormal glucose complicating pregnancy: Secondary | ICD-10-CM | POA: Insufficient documentation

## 2010-07-17 LAB — POCT URINALYSIS DIPSTICK
Nitrite: NEGATIVE
Urobilinogen, UA: 0.2 mg/dL (ref 0.0–1.0)
pH: 5.5 (ref 5.0–8.0)

## 2010-07-17 NOTE — Telephone Encounter (Signed)
Needs labs & holister faxed to [phone], pt there now.

## 2010-07-17 NOTE — Telephone Encounter (Signed)
Faxed to [facility] as requested.Maria [name]

## 2010-07-18 ENCOUNTER — Encounter: Payer: Self-pay | Admitting: Obstetrics & Gynecology

## 2010-07-19 ENCOUNTER — Other Ambulatory Visit: Payer: Self-pay

## 2010-07-19 ENCOUNTER — Encounter: Payer: Self-pay | Admitting: Obstetrics & Gynecology

## 2010-07-19 DIAGNOSIS — Z0189 Encounter for other specified special examinations: Secondary | ICD-10-CM

## 2010-07-19 LAB — CONVERTED CEMR LAB: Creatinine 24 HR UR: 1362 mg/24hr (ref 700–1800)

## 2010-07-24 ENCOUNTER — Other Ambulatory Visit: Payer: Self-pay | Admitting: Obstetrics & Gynecology

## 2010-07-24 ENCOUNTER — Encounter: Payer: Self-pay | Attending: Obstetrics & Gynecology | Admitting: Dietician

## 2010-07-24 ENCOUNTER — Other Ambulatory Visit: Payer: Self-pay

## 2010-07-24 DIAGNOSIS — Z713 Dietary counseling and surveillance: Secondary | ICD-10-CM | POA: Insufficient documentation

## 2010-07-24 DIAGNOSIS — O99810 Abnormal glucose complicating pregnancy: Secondary | ICD-10-CM | POA: Insufficient documentation

## 2010-07-24 DIAGNOSIS — O24919 Unspecified diabetes mellitus in pregnancy, unspecified trimester: Secondary | ICD-10-CM

## 2010-07-24 LAB — POCT URINALYSIS DIPSTICK
Bilirubin Urine: NEGATIVE
Glucose, UA: NEGATIVE mg/dL
Ketones, ur: >=160 mg/dL — AB
Nitrite: NEGATIVE
Protein, ur: 30 mg/dL — AB
Specific Gravity, Urine: >=1.03 (ref 1.005–1.030)
Urobilinogen, UA: 0.2 mg/dL (ref 0.0–1.0)
pH: 5.5 (ref 5.0–8.0)

## 2010-08-04 LAB — RH IMMUNE GLOBULIN WORKUP (NOT WOMEN'S HOSP)
ABO/RH(D): B NEG
Antibody Screen: NEGATIVE

## 2010-08-04 LAB — ABO/RH: ABO/RH(D): B NEG

## 2010-08-07 ENCOUNTER — Encounter: Payer: Self-pay | Admitting: Dietician

## 2010-08-07 ENCOUNTER — Ambulatory Visit (HOSPITAL_COMMUNITY)
Admission: RE | Admit: 2010-08-07 | Discharge: 2010-08-07 | Disposition: A | Discharge status: Home / Self Care | Payer: Self-pay | Source: Ambulatory Visit | Attending: Obstetrics & Gynecology | Admitting: Obstetrics & Gynecology

## 2010-08-07 ENCOUNTER — Other Ambulatory Visit: Payer: Self-pay

## 2010-08-07 DIAGNOSIS — O99810 Abnormal glucose complicating pregnancy: Secondary | ICD-10-CM

## 2010-08-07 DIAGNOSIS — O24919 Unspecified diabetes mellitus in pregnancy, unspecified trimester: Secondary | ICD-10-CM

## 2010-08-07 DIAGNOSIS — Z363 Encounter for antenatal screening for malformations: Secondary | ICD-10-CM | POA: Insufficient documentation

## 2010-08-07 DIAGNOSIS — Z1389 Encounter for screening for other disorder: Secondary | ICD-10-CM | POA: Insufficient documentation

## 2010-08-07 DIAGNOSIS — O358XX0 Maternal care for other (suspected) fetal abnormality and damage, not applicable or unspecified: Secondary | ICD-10-CM | POA: Insufficient documentation

## 2010-08-07 DIAGNOSIS — O34219 Maternal care for unspecified type scar from previous cesarean delivery: Secondary | ICD-10-CM

## 2010-08-07 LAB — POCT URINALYSIS DIP (DEVICE)
Bilirubin Urine: NEGATIVE
Hgb urine dipstick: NEGATIVE
Nitrite: NEGATIVE
Protein, ur: NEGATIVE mg/dL
pH: 6 (ref 5.0–8.0)

## 2010-08-11 ENCOUNTER — Encounter: Payer: Self-pay | Admitting: Family Medicine

## 2010-08-21 ENCOUNTER — Other Ambulatory Visit: Payer: Self-pay | Admitting: Obstetrics & Gynecology

## 2010-08-21 ENCOUNTER — Encounter: Payer: Self-pay | Attending: Obstetrics & Gynecology | Admitting: Dietician

## 2010-08-21 DIAGNOSIS — O34219 Maternal care for unspecified type scar from previous cesarean delivery: Secondary | ICD-10-CM

## 2010-08-21 DIAGNOSIS — Z331 Pregnant state, incidental: Secondary | ICD-10-CM

## 2010-08-21 DIAGNOSIS — Z713 Dietary counseling and surveillance: Secondary | ICD-10-CM | POA: Insufficient documentation

## 2010-08-21 DIAGNOSIS — O99810 Abnormal glucose complicating pregnancy: Secondary | ICD-10-CM

## 2010-08-21 DIAGNOSIS — O361190 Maternal care for Anti-A sensitization, unspecified trimester, not applicable or unspecified: Secondary | ICD-10-CM

## 2010-08-21 LAB — POCT URINALYSIS DIP (DEVICE)
Ketones, ur: NEGATIVE mg/dL
Protein, ur: NEGATIVE mg/dL
pH: 7 (ref 5.0–8.0)

## 2010-09-04 ENCOUNTER — Other Ambulatory Visit: Payer: Self-pay | Admitting: Obstetrics and Gynecology

## 2010-09-04 ENCOUNTER — Other Ambulatory Visit: Payer: Self-pay | Admitting: Family Medicine

## 2010-09-04 DIAGNOSIS — O34219 Maternal care for unspecified type scar from previous cesarean delivery: Secondary | ICD-10-CM

## 2010-09-04 DIAGNOSIS — Z331 Pregnant state, incidental: Secondary | ICD-10-CM

## 2010-09-04 DIAGNOSIS — O99810 Abnormal glucose complicating pregnancy: Secondary | ICD-10-CM

## 2010-09-04 DIAGNOSIS — O361190 Maternal care for Anti-A sensitization, unspecified trimester, not applicable or unspecified: Secondary | ICD-10-CM

## 2010-09-04 DIAGNOSIS — O24419 Gestational diabetes mellitus in pregnancy, unspecified control: Secondary | ICD-10-CM

## 2010-09-04 LAB — POCT URINALYSIS DIP (DEVICE)
Hgb urine dipstick: NEGATIVE
Protein, ur: NEGATIVE mg/dL
Specific Gravity, Urine: 1.025 (ref 1.005–1.030)
Urobilinogen, UA: 0.2 mg/dL (ref 0.0–1.0)

## 2010-09-11 ENCOUNTER — Ambulatory Visit (HOSPITAL_COMMUNITY)
Admission: RE | Admit: 2010-09-11 | Discharge: 2010-09-11 | Disposition: A | Discharge status: Home / Self Care | Payer: Self-pay | Source: Ambulatory Visit | Attending: Family Medicine | Admitting: Family Medicine

## 2010-09-11 DIAGNOSIS — Z3689 Encounter for other specified antenatal screening: Secondary | ICD-10-CM | POA: Insufficient documentation

## 2010-09-11 DIAGNOSIS — O24419 Gestational diabetes mellitus in pregnancy, unspecified control: Secondary | ICD-10-CM

## 2010-09-11 DIAGNOSIS — O99810 Abnormal glucose complicating pregnancy: Secondary | ICD-10-CM | POA: Insufficient documentation

## 2010-09-18 ENCOUNTER — Other Ambulatory Visit: Payer: Self-pay | Admitting: Obstetrics & Gynecology

## 2010-09-18 DIAGNOSIS — O10019 Pre-existing essential hypertension complicating pregnancy, unspecified trimester: Secondary | ICD-10-CM

## 2010-09-18 DIAGNOSIS — Z331 Pregnant state, incidental: Secondary | ICD-10-CM

## 2010-09-18 LAB — POCT URINALYSIS DIP (DEVICE)
Glucose, UA: NEGATIVE mg/dL
Hgb urine dipstick: NEGATIVE
Nitrite: NEGATIVE
Urobilinogen, UA: 0.2 mg/dL (ref 0.0–1.0)
pH: 6 (ref 5.0–8.0)

## 2010-10-02 ENCOUNTER — Other Ambulatory Visit: Payer: Self-pay | Admitting: Obstetrics and Gynecology

## 2010-10-02 ENCOUNTER — Encounter: Payer: Self-pay | Attending: Obstetrics & Gynecology | Admitting: Dietician

## 2010-10-02 DIAGNOSIS — O361190 Maternal care for Anti-A sensitization, unspecified trimester, not applicable or unspecified: Secondary | ICD-10-CM

## 2010-10-02 DIAGNOSIS — Z331 Pregnant state, incidental: Secondary | ICD-10-CM

## 2010-10-02 DIAGNOSIS — O99810 Abnormal glucose complicating pregnancy: Secondary | ICD-10-CM | POA: Insufficient documentation

## 2010-10-02 DIAGNOSIS — O34219 Maternal care for unspecified type scar from previous cesarean delivery: Secondary | ICD-10-CM

## 2010-10-02 DIAGNOSIS — Z713 Dietary counseling and surveillance: Secondary | ICD-10-CM | POA: Insufficient documentation

## 2010-10-02 LAB — POCT URINALYSIS DIP (DEVICE)
Ketones, ur: NEGATIVE mg/dL
Protein, ur: NEGATIVE mg/dL
Specific Gravity, Urine: 1.02 (ref 1.005–1.030)
Urobilinogen, UA: 0.2 mg/dL (ref 0.0–1.0)
pH: 6.5 (ref 5.0–8.0)

## 2010-10-03 NOTE — Op Note (Signed)
Maria [name], Maria [name]                 ACCOUNT NO.:  [id number]   MEDICAL RECORD NO.:  [id number]          PATIENT TYPE:  INP   LOCATION:  9130                          [facility]ILITY:  [facility]   PHYSICIAN:  Kathreen Cosier, M.D.DATE OF BIRTH:  07/26/1980   DATE OF PROCEDURE:  DATE OF DISCHARGE:                               OPERATIVE REPORT   PREOPERATIVE DIAGNOSIS:  Cephalopelvic disproportion.   POSTOPERATIVE DIAGNOSIS:  Cephalopelvic disproportion.   SURGEON:  Kathreen Cosier, M.D.   ANESTHESIA:  Epidural.   PROCEDURE:  The patient placed on the operating table in the supine  position.  Abdomen was prepped and draped.  Bladder entered with a Foley  catheter.  Transverse suprapubic incision was made and carried down  through the rectus fascia.  The fascia was cleaned and incised through  the length of the incision.  The rectus muscles were retracted  laterally.  Peritoneum was incised longitudinally.  A transverse  incision was made at the base of the peritoneum above the bladder.  The  bladder was mobilized inferiorly.  The transverse lower uterine incision  was made and the patient delivered from the OP position of a female, Apgar  8 and 9, weighing 8 pounds 9 ounces.  The placenta was posterior,  removed manually, and sent to pathology.  Uterine cavity was cleaned  with dry laps.  The uterine cavity was cleaned with dry laps.  The team  was in attendance.  The fluid was clear.  The uterine cavity was closed  in one layer with continuous suture of #1 chromic.  Hemostasis was  satisfactory.  The bladder flap was reattached with 2-0 chromic.  The  uterus was contracted.  Tubes and ovaries normal.  Abdomen closed in  layers, peritoneum with continuous suture of 0 chromic, the fascia with  continuous suture of 0 Dexon.  The skin was closed with subcuticular  stitch of 4-0 Monocryl.  Blood loss was 600 mL.  The patient tolerated  the procedure well and taken to the recovery room in good  condition.           ______________________________  Kathreen Cosier, M.D.     BAM/MEDQ  D:  05/27/2007  T:  05/27/2007  Job:  [id number]

## 2010-10-03 NOTE — H&P (Signed)
[name], [name]                 ACCOUNT NO.:  [id number]   MEDICAL RECORD NO.:  [id number]          PATIENT TYPE:  INP   LOCATION:  9130                          [facility]  [facility]   PHYSICIAN:  Kathreen Cosier, M.D.DATE OF BIRTH:  [birth year]   DATE OF ADMISSION:  05/26/2007  DATE OF DISCHARGE:                              HISTORY & PHYSICAL   The patient is a 30 year old gravida 3, para 0-0-2-0, [facility] January 6-14,  2009.  She is a diabetic controlled by diet, followed by [facility].  She was brought in for induction.  She is a negative GBS, negative HIV.  She was contracting every 2-3 minutes at 8:30 on January 5.  Cervix was  1 cm, 90% vertex, minus 2 to minus 3 station.  Membranes ruptured  artificially, fluid clear.  The patient progressed slowly and by 5 p.m.  she was 8 cm, 100%, plus 1 station on 6 milliunits of pitocin.  At 12:35  a.m., she was reported as being fully dilated for 2 hours, by the nurse,  and had been pushing.  On examination, she had a rim of cervix and she  was moved to __________  plus 2 station on 6 milliunits of pitocin.  There was a lot of __________  and it was decided she should be  delivered by C-section for CPD.  She also had a temperature elevation of  100.6 and got ampicillin 2 g IV every 6 hours.  Fetal heart was reaching  170-177.   PHYSICAL EXAMINATION:  GENERAL:  Well-developed female in labor.  HEENT:  Negative.  LUNGS:  Clear.  HEART:  Regular rhythm, no murmurs, no gallops.  BREASTS:  No masses.  ABDOMEN:  Term size abdomen.  Estimated fetal weight 7 lb, 3 oz.  EXTREMITIES:  Negative.  PELVIC:  As described above.           ______________________________  Kathreen Cosier, M.D.     BAM/MEDQ  D:  05/27/2007  T:  05/27/2007  Job:  [id number]

## 2010-10-06 NOTE — Discharge Summary (Signed)
NAMEBURDETTE, GERGELY                 ACCOUNT NO.:  [id number]   MEDICAL RECORD NO.:  [id number]          PATIENT TYPE:  INP   LOCATION:  9130                          [facility]   PHYSICIAN:  Kathreen Cosier, M.D.DATE OF BIRTH:  Apr 02, 1981   DATE OF ADMISSION:  05/26/2007  DATE OF DISCHARGE:  05/29/2007                               DISCHARGE SUMMARY   The patient is a 30 year old, gravida 3, para 0-0-2-0, [facility] May 27, 2007 through June 04, 2007.  She is a diabetic controlled by diet and  followed at [facility] with non-stress test.  GBS is negative.  HIV  is negative.  She was brought in for induction.  She was contracting  every 2 minutes.  Cervix is 1 cm, 90% vertex -2 to -3.  Membranes were  ruptured artificially and the fluid was clear.  By 5:00 p.m. on May 30, 2007, she was 8 cm, 100%, +1 station on 6 milliunits of Pitocin.  By  May 27, 2007 at 12:35 a.m., she was reported as fully dilated 2 hours  previously and pushing.  When I examined her, she was not fully dilated.  Vertex was at +2 station and molding.  When she pushed, there was no  descent of the vertex.  Fetal heart ran between 170-177.  Maternal  temperature 100.6.  It was thus decided she be delivered by C-section  because of failure to progress in labor.  She had a female with Apgar of 8-  9 weighing 8 pounds, 9 ounces from the OP position.  Placenta was  posterior and sent to Pathology.  Postoperatively, she did well.   On admission, hemoglobin 13.5; postop 12.2.  Platelets 115 and 123.  Antibody screen negative.  RPR negative.  HIV negative.  The patient  wanted to go home on the second postoperative day and this was approved.  She was discharged home on Tylox 1-2 every 3-4 hours and to see me in 6  weeks.   DISCHARGE DIAGNOSIS:  Status post primary low transverse cesarean  section in a diabetic controlled on diet for cephalopelvic  disproportion.            ______________________________  Kathreen Cosier, M.D.     BAM/MEDQ  D:  06/11/2007  T:  06/11/2007  Job:  [id number]

## 2010-10-23 ENCOUNTER — Other Ambulatory Visit: Payer: Self-pay | Admitting: Family Medicine

## 2010-10-23 DIAGNOSIS — O99810 Abnormal glucose complicating pregnancy: Secondary | ICD-10-CM

## 2010-10-23 DIAGNOSIS — Z331 Pregnant state, incidental: Secondary | ICD-10-CM

## 2010-10-23 LAB — POCT URINALYSIS DIP (DEVICE)
Protein, ur: NEGATIVE mg/dL
Specific Gravity, Urine: 1.015 (ref 1.005–1.030)
Urobilinogen, UA: 0.2 mg/dL (ref 0.0–1.0)

## 2010-10-30 ENCOUNTER — Other Ambulatory Visit: Payer: Self-pay | Admitting: Family Medicine

## 2010-10-30 ENCOUNTER — Encounter: Payer: Self-pay | Attending: Obstetrics & Gynecology | Admitting: Dietician

## 2010-10-30 ENCOUNTER — Other Ambulatory Visit: Payer: Self-pay | Admitting: Obstetrics & Gynecology

## 2010-10-30 DIAGNOSIS — Z713 Dietary counseling and surveillance: Secondary | ICD-10-CM | POA: Insufficient documentation

## 2010-10-30 DIAGNOSIS — O99810 Abnormal glucose complicating pregnancy: Secondary | ICD-10-CM

## 2010-10-30 DIAGNOSIS — O361190 Maternal care for Anti-A sensitization, unspecified trimester, not applicable or unspecified: Secondary | ICD-10-CM

## 2010-10-30 DIAGNOSIS — O34219 Maternal care for unspecified type scar from previous cesarean delivery: Secondary | ICD-10-CM

## 2010-10-30 LAB — POCT URINALYSIS DIP (DEVICE)
Nitrite: NEGATIVE
Protein, ur: NEGATIVE mg/dL
Urobilinogen, UA: 0.2 mg/dL (ref 0.0–1.0)
pH: 6 (ref 5.0–8.0)

## 2010-10-31 ENCOUNTER — Ambulatory Visit (HOSPITAL_COMMUNITY)
Admission: RE | Admit: 2010-10-31 | Discharge: 2010-10-31 | Disposition: A | Discharge status: Home / Self Care | Payer: Self-pay | Source: Ambulatory Visit | Attending: Obstetrics & Gynecology | Admitting: Obstetrics & Gynecology

## 2010-10-31 DIAGNOSIS — O99810 Abnormal glucose complicating pregnancy: Secondary | ICD-10-CM | POA: Insufficient documentation

## 2010-10-31 DIAGNOSIS — O365990 Maternal care for other known or suspected poor fetal growth, unspecified trimester, not applicable or unspecified: Secondary | ICD-10-CM | POA: Insufficient documentation

## 2010-10-31 DIAGNOSIS — O34219 Maternal care for unspecified type scar from previous cesarean delivery: Secondary | ICD-10-CM | POA: Insufficient documentation

## 2010-11-06 ENCOUNTER — Other Ambulatory Visit: Payer: Self-pay | Admitting: Obstetrics & Gynecology

## 2010-11-06 ENCOUNTER — Encounter: Payer: Self-pay | Admitting: Dietician

## 2010-11-06 DIAGNOSIS — O34219 Maternal care for unspecified type scar from previous cesarean delivery: Secondary | ICD-10-CM

## 2010-11-06 DIAGNOSIS — O99810 Abnormal glucose complicating pregnancy: Secondary | ICD-10-CM

## 2010-11-06 DIAGNOSIS — O361190 Maternal care for Anti-A sensitization, unspecified trimester, not applicable or unspecified: Secondary | ICD-10-CM

## 2010-11-06 LAB — POCT URINALYSIS DIP (DEVICE)
Bilirubin Urine: NEGATIVE
Hgb urine dipstick: NEGATIVE
Leukocytes, UA: NEGATIVE
Nitrite: NEGATIVE
Protein, ur: NEGATIVE mg/dL
Specific Gravity, Urine: 1.015 (ref 1.005–1.030)
Urobilinogen, UA: 0.2 mg/dL (ref 0.0–1.0)
pH: 6 (ref 5.0–8.0)

## 2010-11-20 ENCOUNTER — Other Ambulatory Visit: Payer: Self-pay | Admitting: Obstetrics & Gynecology

## 2010-11-20 ENCOUNTER — Encounter: Payer: Self-pay | Attending: Obstetrics & Gynecology | Admitting: Dietician

## 2010-11-20 DIAGNOSIS — O99810 Abnormal glucose complicating pregnancy: Secondary | ICD-10-CM

## 2010-11-20 DIAGNOSIS — Z3689 Encounter for other specified antenatal screening: Secondary | ICD-10-CM

## 2010-11-20 DIAGNOSIS — Z713 Dietary counseling and surveillance: Secondary | ICD-10-CM | POA: Insufficient documentation

## 2010-11-20 LAB — POCT URINALYSIS DIP (DEVICE)
Protein, ur: NEGATIVE mg/dL
Specific Gravity, Urine: 1.015 (ref 1.005–1.030)
Urobilinogen, UA: 0.2 mg/dL (ref 0.0–1.0)

## 2010-12-04 ENCOUNTER — Other Ambulatory Visit: Payer: Self-pay | Admitting: Obstetrics & Gynecology

## 2010-12-04 ENCOUNTER — Ambulatory Visit: Payer: Self-pay | Admitting: *Deleted

## 2010-12-04 ENCOUNTER — Other Ambulatory Visit: Payer: Self-pay | Admitting: Family Medicine

## 2010-12-04 ENCOUNTER — Encounter: Payer: Self-pay | Admitting: Dietician

## 2010-12-04 DIAGNOSIS — O99810 Abnormal glucose complicating pregnancy: Secondary | ICD-10-CM

## 2010-12-04 DIAGNOSIS — O34219 Maternal care for unspecified type scar from previous cesarean delivery: Secondary | ICD-10-CM

## 2010-12-04 DIAGNOSIS — O361190 Maternal care for Anti-A sensitization, unspecified trimester, not applicable or unspecified: Secondary | ICD-10-CM

## 2010-12-04 LAB — POCT URINALYSIS DIP (DEVICE)
Glucose, UA: NEGATIVE mg/dL
Hgb urine dipstick: NEGATIVE
Ketones, ur: NEGATIVE mg/dL
Protein, ur: NEGATIVE mg/dL
Specific Gravity, Urine: 1.02 (ref 1.005–1.030)
Urobilinogen, UA: 0.2 mg/dL (ref 0.0–1.0)

## 2010-12-04 LAB — WET PREP, GENITAL: Trich, Wet Prep: NONE SEEN

## 2010-12-04 NOTE — Progress Notes (Signed)
BG levels 11/27/2010: Self-BGM Fasting range:71-88 mg 2 hr. After BK: 77-121: 2 hr after lunch:94-121; 2 hr. After dinner; 90-121.  Provided 1 box strips and 1 box lancets.  Levels WNL for GDM.  Maggie May RN, RD, CDE

## 2010-12-05 LAB — GC/CHLAMYDIA PROBE AMP, URINE
Chlamydia, Swab/Urine, PCR: NEGATIVE
GC Probe Amp, Urine: NEGATIVE

## 2010-12-11 ENCOUNTER — Other Ambulatory Visit: Payer: Self-pay | Admitting: Obstetrics & Gynecology

## 2010-12-11 ENCOUNTER — Encounter: Payer: Self-pay | Admitting: Dietician

## 2010-12-11 DIAGNOSIS — O34219 Maternal care for unspecified type scar from previous cesarean delivery: Secondary | ICD-10-CM

## 2010-12-11 DIAGNOSIS — O99810 Abnormal glucose complicating pregnancy: Secondary | ICD-10-CM

## 2010-12-11 DIAGNOSIS — O361190 Maternal care for Anti-A sensitization, unspecified trimester, not applicable or unspecified: Secondary | ICD-10-CM

## 2010-12-11 LAB — POCT URINALYSIS DIP (DEVICE)
Bilirubin Urine: NEGATIVE
Nitrite: NEGATIVE
Protein, ur: NEGATIVE mg/dL
Urobilinogen, UA: 0.2 mg/dL (ref 0.0–1.0)
pH: 5.5 (ref 5.0–8.0)

## 2010-12-14 ENCOUNTER — Ambulatory Visit (HOSPITAL_COMMUNITY)
Admission: RE | Admit: 2010-12-14 | Discharge: 2010-12-14 | Disposition: A | Discharge status: Home / Self Care | Payer: Self-pay | Source: Ambulatory Visit | Attending: Obstetrics & Gynecology | Admitting: Obstetrics & Gynecology

## 2010-12-14 DIAGNOSIS — O99810 Abnormal glucose complicating pregnancy: Secondary | ICD-10-CM | POA: Insufficient documentation

## 2010-12-14 DIAGNOSIS — O365990 Maternal care for other known or suspected poor fetal growth, unspecified trimester, not applicable or unspecified: Secondary | ICD-10-CM | POA: Insufficient documentation

## 2010-12-14 DIAGNOSIS — O34219 Maternal care for unspecified type scar from previous cesarean delivery: Secondary | ICD-10-CM | POA: Insufficient documentation

## 2010-12-14 DIAGNOSIS — Z3689 Encounter for other specified antenatal screening: Secondary | ICD-10-CM

## 2010-12-18 ENCOUNTER — Encounter: Payer: Self-pay | Admitting: Obstetrics & Gynecology

## 2010-12-18 ENCOUNTER — Other Ambulatory Visit: Payer: Self-pay | Admitting: Family Medicine

## 2010-12-18 DIAGNOSIS — O24919 Unspecified diabetes mellitus in pregnancy, unspecified trimester: Secondary | ICD-10-CM

## 2010-12-18 LAB — POCT URINALYSIS DIP (DEVICE)
Ketones, ur: NEGATIVE mg/dL
Nitrite: NEGATIVE
Protein, ur: NEGATIVE mg/dL
Urobilinogen, UA: 0.2 mg/dL (ref 0.0–1.0)
pH: 5 (ref 5.0–8.0)

## 2010-12-23 ENCOUNTER — Encounter (HOSPITAL_COMMUNITY): Payer: Self-pay

## 2010-12-23 ENCOUNTER — Inpatient Hospital Stay (HOSPITAL_COMMUNITY)
Admission: AD | Admit: 2010-12-23 | Discharge: 2010-12-26 | DRG: 775 | Disposition: A | Discharge status: Home / Self Care | Payer: Medicaid Other | Source: Ambulatory Visit | Attending: Obstetrics & Gynecology | Admitting: Obstetrics & Gynecology

## 2010-12-23 DIAGNOSIS — O99814 Abnormal glucose complicating childbirth: Secondary | ICD-10-CM | POA: Diagnosis present

## 2010-12-23 DIAGNOSIS — O34219 Maternal care for unspecified type scar from previous cesarean delivery: Secondary | ICD-10-CM | POA: Diagnosis present

## 2010-12-23 HISTORY — DX: Mucocele of salivary gland: K11.6

## 2010-12-23 NOTE — Progress Notes (Signed)
Pt states, " My water broke at 10:50 pm, It ran out for 10-15 min, and my pad is soaked. I am not having contractions yet."

## 2010-12-24 ENCOUNTER — Encounter (HOSPITAL_COMMUNITY): Payer: Self-pay

## 2010-12-24 DIAGNOSIS — O34219 Maternal care for unspecified type scar from previous cesarean delivery: Secondary | ICD-10-CM

## 2010-12-24 DIAGNOSIS — O99814 Abnormal glucose complicating childbirth: Secondary | ICD-10-CM

## 2010-12-24 LAB — GLUCOSE, CAPILLARY
Glucose-Capillary: 92 mg/dL (ref 70–99)
Glucose-Capillary: 223 mg/dL — ABNORMAL HIGH (ref 70–99)

## 2010-12-24 LAB — CBC
Platelets: 135 10*3/uL — ABNORMAL LOW (ref 150–400)
RDW: 13.4 % (ref 11.5–15.5)
WBC: 7.2 10*3/uL (ref 4.0–10.5)

## 2010-12-24 LAB — GLUCOSE, RANDOM: Glucose, Bld: 215 mg/dL — ABNORMAL HIGH (ref 70–99)

## 2010-12-24 MED ORDER — ONDANSETRON HCL 4 MG/2ML IJ SOLN: 4.0000 mg | Freq: Four times a day (QID) | INTRAMUSCULAR | Status: DC | PRN

## 2010-12-24 MED ORDER — BENZOCAINE-MENTHOL 20-0.5 % EX AERO
1.0000 "application " | INHALATION_SPRAY | CUTANEOUS | Status: DC | PRN
  Administered 2010-12-25: 1 via TOPICAL

## 2010-12-24 MED ORDER — NALBUPHINE SYRINGE 5 MG/0.5 ML
5.0000 mg | INJECTION | INTRAMUSCULAR | Status: DC | PRN
  Administered 2010-12-24: 5 mg via INTRAVENOUS
  Filled 2010-12-24 (×2): qty 0.5

## 2010-12-24 MED ORDER — SENNOSIDES-DOCUSATE SODIUM 8.6-50 MG PO TABS
2.0000 | ORAL_TABLET | Freq: Every day | ORAL | Status: DC
  Administered 2010-12-25: 2 via ORAL

## 2010-12-24 MED ORDER — OXYTOCIN 20 UNITS IN LACTATED RINGERS INFUSION - SIMPLE
125.0000 mL/h | Freq: Once | INTRAVENOUS | Status: AC
Start: 2010-12-24 — End: 2010-12-24
  Administered 2010-12-24: 125 mL/h via INTRAVENOUS
  Filled 2010-12-24: qty 1000

## 2010-12-24 MED ORDER — SIMETHICONE 80 MG PO CHEW: 80.0000 mg | CHEWABLE_TABLET | ORAL | Status: DC | PRN

## 2010-12-24 MED ORDER — TETANUS-DIPHTH-ACELL PERTUSSIS 5-2.5-18.5 LF-MCG/0.5 IM SUSP
0.5000 mL | Freq: Once | INTRAMUSCULAR | Status: AC
  Administered 2010-12-25: 0.5 mL via INTRAMUSCULAR
  Filled 2010-12-24: qty 0.5

## 2010-12-24 MED ORDER — LACTATED RINGERS IV SOLN
INTRAVENOUS | Status: DC
  Administered 2010-12-24: 02:00:00 via INTRAVENOUS

## 2010-12-24 MED ORDER — ZOLPIDEM TARTRATE 5 MG PO TABS: 5.0000 mg | ORAL_TABLET | Freq: Every evening | ORAL | Status: DC | PRN

## 2010-12-24 MED ORDER — OXYCODONE-ACETAMINOPHEN 5-325 MG PO TABS: 2.0000 | ORAL_TABLET | ORAL | Status: DC | PRN

## 2010-12-24 MED ORDER — LANOLIN HYDROUS EX OINT: TOPICAL_OINTMENT | CUTANEOUS | Status: DC | PRN

## 2010-12-24 MED ORDER — ONDANSETRON HCL 4 MG PO TABS: 4.0000 mg | ORAL_TABLET | ORAL | Status: DC | PRN

## 2010-12-24 MED ORDER — DIBUCAINE 1 % RE OINT: 1.0000 "application " | TOPICAL_OINTMENT | RECTAL | Status: DC | PRN

## 2010-12-24 MED ORDER — CITRIC ACID-SODIUM CITRATE 334-500 MG/5ML PO SOLN: 30.0000 mL | ORAL | Status: DC | PRN

## 2010-12-24 MED ORDER — IBUPROFEN 600 MG PO TABS
600.0000 mg | ORAL_TABLET | Freq: Four times a day (QID) | ORAL | Status: DC
  Administered 2010-12-24 – 2010-12-26 (×7): 600 mg via ORAL
  Filled 2010-12-24 (×7): qty 1

## 2010-12-24 MED ORDER — FLEET ENEMA 7-19 GM/118ML RE ENEM: 1.0000 | ENEMA | RECTAL | Status: DC | PRN

## 2010-12-24 MED ORDER — LACTATED RINGERS IV SOLN: 500.0000 mL | INTRAVENOUS | Status: DC | PRN

## 2010-12-24 MED ORDER — WITCH HAZEL-GLYCERIN EX PADS: 1.0000 "application " | MEDICATED_PAD | CUTANEOUS | Status: DC | PRN

## 2010-12-24 MED ORDER — ONDANSETRON HCL 4 MG/2ML IJ SOLN: 4.0000 mg | INTRAMUSCULAR | Status: DC | PRN

## 2010-12-24 MED ORDER — IBUPROFEN 600 MG PO TABS
600.0000 mg | ORAL_TABLET | Freq: Four times a day (QID) | ORAL | Status: DC | PRN
  Administered 2010-12-24: 600 mg via ORAL
  Filled 2010-12-24: qty 1

## 2010-12-24 MED ORDER — OXYCODONE-ACETAMINOPHEN 5-325 MG PO TABS
1.0000 | ORAL_TABLET | ORAL | Status: DC | PRN
  Administered 2010-12-25: 1 via ORAL
  Filled 2010-12-24: qty 1

## 2010-12-24 MED ORDER — ACETAMINOPHEN 325 MG PO TABS: 650.0000 mg | ORAL_TABLET | ORAL | Status: DC | PRN

## 2010-12-24 MED ORDER — DIPHENHYDRAMINE HCL 25 MG PO CAPS: 25.0000 mg | ORAL_CAPSULE | Freq: Four times a day (QID) | ORAL | Status: DC | PRN

## 2010-12-24 MED ORDER — PRENATAL PLUS 27-1 MG PO TABS
1.0000 | ORAL_TABLET | Freq: Every day | ORAL | Status: DC
  Administered 2010-12-24 – 2010-12-25 (×2): 1 via ORAL
  Filled 2010-12-24 (×2): qty 1

## 2010-12-24 MED ORDER — LIDOCAINE HCL (PF) 1 % IJ SOLN
30.0000 mL | INTRAMUSCULAR | Status: DC | PRN
  Filled 2010-12-24 (×2): qty 30

## 2010-12-24 NOTE — Plan of Care (Signed)
Problem: Consults Goal: Control and instrumentation engineer Patient Education (See Patient Education module for education specifics.) Outcome: Completed/Met Date Met:  12/24/10 Education completed with assistance from spanish interpreter. Goal: [employer] Patient Information Press F2 to bring up selections list Outcome: Completed/Met Date Met:  12/24/10  Pt 37-[redacted] weeks EGA, Diabetic and Non-English Speaking

## 2010-12-24 NOTE — Progress Notes (Signed)
  Maria [name] is a 30 y.o. G4P1021 at [redacted]w[redacted]d by [place] admitted for rupture of membranes  Subjective: Pt feeling contractions every 5 min. Pain 8/10.   Objective: BP 102/71  Pulse 94  Temp(Src) 98.1 F (36.7 C) (Oral)  Resp 18  Ht 5' 2.25" (1.581 m)  Wt 148 lb 4 oz (67.246 kg)  BMI 26.90 kg/m2  LMP 03/22/2010  Breastfeeding? Unknown      FHT:  FHR:145, Moderate variability, present accels, no decels UC:   regular, every 2-3 minutes SVE:   Dilation: 3 Effacement (%): 60 Station: -2 Exam by:: dr Gwenlyn Saran  Labs: Lab Results  Component Value Date   WBC 7.2 12/24/2010   HGB 13.1 12/24/2010   HCT 37.6 12/24/2010   MCV 85.6 12/24/2010   PLT 135* 12/24/2010    Assessment / Plan: 30 yo G4P1021 at 39.4wga with SROM and undergoing VBAC  Labor: Progressing normally Fetal Wellbeing:  Category I.  Pain Control:  Labor support without medications. Will let patient walk off the monitor for pain control.  I/D:  GBS neg Anticipated MOD:  NSVD  Carnie Bruemmer 12/24/2010, 5:07 AM

## 2010-12-24 NOTE — Initial Assessments (Signed)
Spontaneous vaginal delivery of viable female see delivery record 

## 2010-12-24 NOTE — Progress Notes (Signed)
Admission & assessment completed with assistance from spanish interpreter.

## 2010-12-24 NOTE — Progress Notes (Signed)
With family member interpreting for me, mom reports that baby breastfed well with no pain. No questions at present. Spanish handout given.

## 2010-12-24 NOTE — H&P (Signed)
Maria [name] is a 30 y.o. female [phone] with IUP at [redacted]w[redacted]d presenting for spontaneous membrane rupture that occurred at 22:50 today. Denies any vaginal bleeding, any contractions. Reports good fetal movement. She has had one previous C-section in 2009 (LTCS with 1 laye closure) and would like to TOLAC.  [facility] at [facility] at 15wga then transferred to [facility] for GDMA1. Prenatal History/Complications: GDM, diet controled H/o LTCS X1: wants TOLAC Rh neg, s/p Rhogam 10/02/2010 Past Medical History: Past Medical History  Diagnosis Date  . Rh incompatibility   . Mucocele of salivary gland   . Diabetes mellitus     Past Surgical History: Past Surgical History  Procedure Date  . Cesarean section     Obstetrical History: OB History    Grav Para Term Preterm Abortions TAB SAB Ect Mult Living   4 1 1  0 2     1    #1: TAB at 11wga: 07/05/06 #2: 05/27/2007: 40wga, LTCS due to failure to progress (1 layer closure). Weight: 8.9lbs #3: TAB at 12wga 11/02/09  Social History: History   Social History  . Marital Status: Married    Spouse Name: N/A    Number of Children: N/A  . Years of Education: N/A   Social History Main Topics  . Smoking status: Never Smoker   . Smokeless tobacco: Never Used  . Alcohol Use: No  . Drug Use: No  . Sexually Active: Yes   Other Topics Concern  . None   Social History Narrative  . None    Family History: Family History  Problem Relation Age of Onset  . Diabetes Mother   . Diabetes Father   . Diabetes Maternal Grandmother   . Hearing loss Sister     Allergies: No Known Allergies  Prescriptions prior to admission  Medication Sig Dispense Refill  . prenatal vitamin w/FE, FA (PRENATAL 1 + 1) 27-1 MG TABS Take 1 tablet by mouth daily.          Review of Systems - Negative except per HPI   Blood pressure 117/83, pulse 93, temperature 99.2 F (37.3 C), temperature source Oral, resp. rate 20, height 5' 2.25" (1.581 m), weight 148  lb 4 oz (67.246 kg), last menstrual period 03/22/2010, unknown if currently breastfeeding. General appearance: alert and cooperative Lungs: clear to auscultation bilaterally Heart: regular rate and rhythm, S1, S2 normal, no murmur, click, rub or gallop Abdomen: gravid, soft non tender, non distended Extremities: extremities normal, atraumatic, no cyanosis or edema cephalic Baseline: 145 bpm, Variability: Good {> 6 bpm), Accelerations: Reactive and Decelerations: Absent Contractions: irregular, 6-44min  SVE: 2/50/-2 Prenatal labs: ABO, Rh: B NEG (08/25 1545) Antibody: NEG (02/08 1046) Rubella:   RPR: NON REAC (02/08 1046)  HBsAg: NEGATIVE (02/08 1046)  HIV: Non-reactive (02/18 0000)  GBS: NEGATIVE (07/16 0917)  1 hr Glucola: 253 Genetic screening: declined Anatomy US: 12/14/10: fetal head size downward trending now <3%, no intracranial abnormality, EFW: 6.5lbs   Assessment: Maria [name] is a 30 y.o. A5W0981 with an IUP at [redacted]w[redacted]d presenting for SROM.  Plan: 1. Admit to L&D 2. Will recheck in 2 hours to assess for need for Pitocin.  3. Pain control: pt wants to wait and decide when pain becomes greater.    Maria [name] 12/24/2010, 1:49 AM

## 2010-12-24 NOTE — Progress Notes (Signed)
   Maria [name] is a 30 y.o. G4P1021 at [redacted]w[redacted]d by [place] admitted for rupture of membranes  Subjective: Pt feeling urge to push, coping well. Beginning pushing efforts, standing at bedside  Objective: BP 112/75  Pulse 92  Temp(Src) 98.2 F (36.8 C) (Oral)  Resp 18  Ht 5' 2.25" (1.581 m)  Wt 67.246 kg (148 lb 4 oz)  BMI 26.90 kg/m2  LMP 03/22/2010  Breastfeeding? Unknown      FHT:  FHR:145, Moderate variability, present accels, no decels UC:   regular, every 2-3 minutes SVE:   Dilation: 10 Effacement (%): 100 Station: 0 Exam by:: S Grindstaff RN  Labs: Lab Results  Component Value Date   WBC 7.2 12/24/2010   HGB 13.1 12/24/2010   HCT 37.6 12/24/2010   MCV 85.6 12/24/2010   PLT 135* 12/24/2010    Assessment / Plan: 30 yo G4P1021 at 39.4wga with SROM and undergoing TOLAC  Labor: Progressing normally Fetal Wellbeing:  Category I.  Pain Control:  Labor support without medications.  I/D:  GBS neg Anticipated MOD:  NSVD  Mireyah Chervenak 12/24/2010, 9:08 AM

## 2010-12-24 NOTE — Progress Notes (Deleted)
Assessment & admission completed with assistance from spanish interpreter.

## 2010-12-25 LAB — GLUCOSE, CAPILLARY: Glucose-Capillary: 72 mg/dL (ref 70–99)

## 2010-12-25 MED ORDER — BENZOCAINE-MENTHOL 20-0.5 % EX AERO
INHALATION_SPRAY | CUTANEOUS | Status: AC
  Filled 2010-12-25: qty 56

## 2010-12-25 NOTE — Progress Notes (Signed)
12/25/2010 Maria [name]  Interpreter  I asssited [facility] with plan of care.

## 2010-12-25 NOTE — Progress Notes (Signed)
Mother has limited english., family member speaks english. Basic teaching done. Didn't see latch . Mom had given 5 ml of formula after breastfeeding on both breast . inst mom to cue feed and her milk vol would increase.

## 2010-12-25 NOTE — Progress Notes (Signed)
UR chart review completed.  

## 2010-12-25 NOTE — Progress Notes (Signed)
Mother speaks little english. Family member in to interpret all teaching. Mother states she gave 1 bottle only 5ml. Encouraged to limit amt of formula,. inst in cue feeding infant. Gave hand pump with inst.

## 2010-12-25 NOTE — Progress Notes (Signed)
Post Partum Day 1 Subjective: no complaints, doing well, breast feeding, lochia decreasing.  Pain controlled.  Objective: Blood pressure 117/67, pulse 64, temperature 98.4 F (36.9 C), temperature source Oral, resp. rate 18, height 5' 2.25" (1.581 m), weight 148 lb 4 oz (67.246 kg), last menstrual period 03/22/2010, unknown if currently breastfeeding.  Physical Exam:  General: alert, cooperative, appears stated age and no distress Lochia: appropriate Uterine Fundus: firm Abd: Soft, appropriately tender. Incision: no incision DVT Evaluation: No evidence of DVT seen on physical exam.   Basename 12/24/10 0211  HGB 13.1  HCT 37.6    Assessment/Plan: Discharge home, Breastfeeding and Contraception condoms   LOS: 2 days   Maria [name] 12/25/2010, 7:17 AM

## 2010-12-26 ENCOUNTER — Telehealth: Payer: Self-pay | Admitting: Family Medicine

## 2010-12-26 MED ORDER — IBUPROFEN 600 MG PO TABS: 600.0000 mg | ORAL_TABLET | Freq: Four times a day (QID) | ORAL | Status: AC

## 2010-12-26 MED ORDER — OXYCODONE-ACETAMINOPHEN 5-325 MG PO TABS: 1.0000 | ORAL_TABLET | ORAL | Status: AC | PRN

## 2010-12-26 MED ORDER — DOCUSATE SODIUM 100 MG PO CAPS: 100.0000 mg | ORAL_CAPSULE | Freq: Two times a day (BID) | ORAL | Status: AC

## 2010-12-26 NOTE — Telephone Encounter (Signed)
Mystery solved.  The person who called is Maria [name] (from [place]) with the  Same DOB.  Did some investigating and found out it was a mix up on [facility]' part.  I call the [facility] and explained the mix up and told her if she received another text to call us back.  I contacted [facility] and gave them the correct Kindel's address and phone number.

## 2010-12-26 NOTE — Telephone Encounter (Signed)
Pt called to say she got a text from [phone] stating her meds were ready to be picked up.  The person that called me has never been to [place].  She thinks that someone has her name and info.  Verified that her social was not used. She stated that her kids info had been stolen earlier.  She would like to know how this can be fixed.

## 2010-12-26 NOTE — Discharge Summary (Signed)
Obstetric Discharge Summary Reason for Admission: onset of labor and rupture of membranes Prenatal Procedures: none Intrapartum Procedures: spontaneous vaginal delivery Postpartum Procedures: none Complications-Operative and Postpartum: second degree perineal laceration Hemoglobin  Date Value Range Status  12/24/2010 13.1  12.0-15.0 (g/dL) Final     HCT  Date Value Range Status  12/24/2010 37.6  36.0-46.0 (%) Final    Discharge Diagnoses: Term Pregnancy-delivered  Discharge Information: Date: 12/26/2010 Activity: pelvic rest Diet: routine Medications: PNV, Ibuprophen, Colace and Percocet Condition: stable Instructions: refer to practice specific booklet Discharge to: home   Newborn Data: Live born female  Birth Weight: 7 lb 4.4 oz (3300 g) APGAR: 9, 9 Blood type A-  Home with mother.  Maria [name] 12/26/2010, 7:14 AM

## 2010-12-26 NOTE — Progress Notes (Addendum)
Post Partum Day 2 Subjective: no complaints, doing well, breast feeding, lochia decreasing.  Pain controlled.  Stayed yesterday per pediatrics.  Objective: Blood pressure 103/69, pulse 64, temperature 97.7 F (36.5 C), temperature source Oral, resp. rate 18, height 5' 2.25" (1.581 m), weight 148 lb 4 oz (67.246 kg), last menstrual period 03/22/2010, SpO2 99.00%, unknown if currently breastfeeding.  Physical Exam:  General: alert, cooperative, appears stated age and no distress Lochia: appropriate Uterine Fundus: firm Abd: Soft, appropriately tender. Incision: no incision DVT Evaluation: No evidence of DVT seen on physical exam.   Basename 12/24/10 0211  HGB 13.1  HCT 37.6    Assessment/Plan: Discharge home, Breastfeeding and Contraception condoms F/u in 6 weeks at HD Pain control with Motrin/Percocet   LOS: 3 days   Dreamer Carillo 12/26/2010, 7:12 AM      Baby is blood type A neg so no Rhogam is indicated. Estel Scholze 12/26/2010,7:43 AM

## 2010-12-26 NOTE — Discharge Instructions (Signed)
Call the health department for a postpartum check in 6 weeks. Take Motrin and Percocet for pain control. Enjoy your baby!

## 2011-01-29 ENCOUNTER — Other Ambulatory Visit: Payer: Self-pay

## 2011-01-29 ENCOUNTER — Ambulatory Visit: Payer: Self-pay | Admitting: Family Medicine

## 2011-01-29 DIAGNOSIS — O24439 Gestational diabetes mellitus in the puerperium, unspecified control: Secondary | ICD-10-CM

## 2011-02-02 ENCOUNTER — Telehealth: Payer: Self-pay | Admitting: Family Medicine

## 2011-02-02 NOTE — Telephone Encounter (Signed)
This patient has not had her 6-wk postpartum visit as far as I can tell. Additionally, she did need the 2- hr glucose test but her 1 hr value is missing. Please call the patient and see if she had a PCP she can get into to discuss the possibility of her having Type 2 Diabetes and requiring therapy. Thanks!

## 2011-02-05 NOTE — Telephone Encounter (Signed)
Called pt with interpreter VIck[place]. Advised her of abnormal 2 hr gtt, she goes to [facility]. She will follow up with her PCP. She will be coming here for her postpartum checkup on 10/3

## 2011-02-08 LAB — CBC
Hemoglobin: 13.5
MCHC: 34.8
MCV: 87.4
MCV: 88.4
Platelets: 123 — ABNORMAL LOW
RBC: 4.41
WBC: 16.8 — ABNORMAL HIGH
WBC: 7.5

## 2011-02-08 LAB — RH IMMUNE GLOB WKUP(>/=20WKS)(NOT WOMEN'S HOSP)

## 2011-02-08 LAB — RPR: RPR Ser Ql: NONREACTIVE

## 2011-02-19 ENCOUNTER — Ambulatory Visit (INDEPENDENT_AMBULATORY_CARE_PROVIDER_SITE_OTHER): Payer: Self-pay | Admitting: Family Medicine

## 2011-02-19 ENCOUNTER — Encounter: Payer: Self-pay | Admitting: Obstetrics and Gynecology

## 2011-02-19 ENCOUNTER — Encounter: Payer: Self-pay | Admitting: Family Medicine

## 2011-02-19 VITALS — BP 109/75 | HR 64 | Wt 129.5 lb

## 2011-02-19 DIAGNOSIS — Z23 Encounter for immunization: Secondary | ICD-10-CM

## 2011-02-19 DIAGNOSIS — Z124 Encounter for screening for malignant neoplasm of cervix: Secondary | ICD-10-CM | POA: Insufficient documentation

## 2011-02-19 DIAGNOSIS — O99810 Abnormal glucose complicating pregnancy: Secondary | ICD-10-CM

## 2011-02-19 DIAGNOSIS — O24419 Gestational diabetes mellitus in pregnancy, unspecified control: Secondary | ICD-10-CM

## 2011-02-19 LAB — POCT GLYCOSYLATED HEMOGLOBIN (HGB A1C): Hemoglobin A1C: 5.5

## 2011-02-19 NOTE — Assessment & Plan Note (Signed)
Last Pap smear was in 2010. Patient will have her 6 week postpartum check and will receive a Pap smear at that visit.

## 2011-02-19 NOTE — Assessment & Plan Note (Signed)
I think Maria [name] does not have a diagnosis of diabetes currently.   She's lost weight following delivery and continues to follow a low carbohydrate diet.  Her A1c today is nondiagnostic for diabetes. Plan to continue to encourage adherence to a diabetic diet and healthy weight.   I gave a handout on diabetes meal planning.  Plan to return to clinic in approximately one year or sooner as needed.   Patient will additionally receive a flu shot today.

## 2011-02-19 NOTE — Patient Instructions (Signed)
Gracias por venir hoy. Si sigue con una dieta saludable no va a desarrollar diabetes. Manana probablemente le Zenaida Niece a hacer Papan[place]. Le voy a dar ideas de dieta para diabetes. Debe estar orgullosa de usted , que se ha cuidado muy bien durante su Psychiatrist. Siga cuidandose usted  y a su bebe.

## 2011-02-19 NOTE — Progress Notes (Signed)
Maria [name] presents to clinic today to followup her gestational diabetes.  She was diagnosed with type I a gestational diabetes during her pregnancy.  She adequately controlled her glucose with diet alone.  Since her delivery she had a 2 hour glucose tolerance test which was 157 on September 10.  In the interim her fasting glucoses have usually been less than 100 and always less than 120.  She continues to follow a diabetic diet.  She feels well and has lost weight as noted below.  Wt Readings from Last 3 Encounters:  02/19/11 129 lb 8 oz (58.741 kg)  12/23/10 148 lb 4 oz (67.246 kg)  07/05/10 140 lb 11.2 oz (63.821 kg)   PMH reviewed.  ROS as above otherwise neg Medications reviewed.  Exam:  BP 109/75  Pulse 64  Wt 129 lb 8 oz (58.741 kg)  Breastfeeding? Yes Gen: Well NAD, non-obese. Heart regular rate and rhythm no murmurs rubs or gallops. Extremities nonedematous well perfused.   Lab Results  Component Value Date   HGBA1C 5.5 02/19/2011

## 2011-02-20 ENCOUNTER — Encounter: Payer: Self-pay | Admitting: Family Medicine

## 2011-02-21 ENCOUNTER — Ambulatory Visit (INDEPENDENT_AMBULATORY_CARE_PROVIDER_SITE_OTHER): Payer: Self-pay | Admitting: Advanced Practice Midwife

## 2011-02-21 NOTE — Progress Notes (Signed)
  Subjective:     Maria [name] is a 30 y.o. female who presents for a postpartum visit. She is 7 weeks postpartum following a spontaneous vaginal delivery. I have fully reviewed the prenatal and intrapartum course.  This was a VBAC at 39.4 weeks. Outcome: vaginal birth after cesarean (VBAC). Anesthesia: none. Postpartum course has been uneventful. Baby's course has been uneventful. Baby is feeding by breast. Bleeding no bleeding. Bowel function is normal. Bladder function is normal. Patient is sexually active. Contraception method is condoms. Postpartum depression screening: negative with a score of 4.  The following portions of the patient's history were reviewed and updated as appropriate: allergies, current medications, past family history, past medical history, past social history, past surgical history and problem list.  Review of Systems Pertinent items are noted in HPI.   Objective:    BP 103/66  Pulse 68  Temp(Src) 97 F (36.1 C) (Oral)  Wt 128 lb 9.6 oz (58.333 kg)  Breastfeeding? Yes  General:  alert, cooperative, appears stated age and no distress   Breasts:  inspection negative, no nipple discharge or bleeding, no masses or nodularity palpable  Lungs: clear to auscultation bilaterally  Heart:  normal rate  Abdomen: soft, non-tender; bowel sounds normal; no masses,  no organomegaly   Vulva:  normal  Vagina: normal vagina  Cervix:  anteverted  Corpus: normal  Adnexa:  normal adnexa  Rectal Exam: Not performed.        Assessment:    Normal  postpartum exam. Pap smear not done at today's visit.   Plan:    1. Contraception: condoms 2. Declines other contraceptives.  3. Follow up in: 6 months or as needed.

## 2011-02-21 NOTE — Patient Instructions (Signed)
Cuidados preventivos en las mujeres adultas (Preventative Care for Adults - Female) Algunos estudios han demostrado que actualmente la mitad de las muertes en los [place] se deben a un estilo de vida poco saludable. En l se incluye el hecho de ignorar las indicaciones de un cuidado preventivo. Las pautas preventivas de salud para las mujeres incluyen las siguientes prcticas clave:   Un examen fsico anual de rutina es una buena manera de consultar con su mdico de cabecera sobre su salud y controles preventivos. Es la oportunidad de compartir las preocupaciones y novedades sobre su salud, y realizarse un examen a fondo.   Si usted fuma cigarrillos, consulte con su mdico cmo dejarlo. Esto, literalmente, puede salvar su vida, no importa cunto tiempo haya sido consumidor de tabaco. Si no lo hace, no comience.   Mantenga una dieta saludable y un peso normal. El aumento de peso conduce a problemas con la presin arterial y a la diabetes. Disminuya el consumo de grasas de la dieta y aumente el ejercicio. Coma una variedad de alimentos, incluyendo frutas, verduras, protena animal y vegetal (carne, pescado, pollo y huevos, o frijoles, lentejas y queso de soja) y granos, como el arroz. Si es necesario, pdale una dieta adecuada al profesional que lo asiste.   El ejercicio aerbico ayuda a mantener la buena salud del corazn. Los CDC y el American College of Sports Medicine recomiendan 30 minutos de ejercicio de intensidad moderada (caminar a paso ligero que aumenta su ritmo cardaco y la respiracin) en casi todos los das de la semana. La presin arterial elevada que persiste debe tratarse con medicamentos si la prdida de peso y el ejercicio no son efectivos.   Evite el fumar y el beber en exceso (ms de dos copas por da) o el uso de drogas callejeras. No comparta agujas. Pida ayuda si necesita asistencia o instrucciones con respecto a abandonar el consumo de alcohol, cigarrillos o drogas.    Mantenga los lpidos colesterol en sangre normales reduciendo al mnimo su consumo de grasas saturadas. Consuma una dieta balanceada con frutas y verduras en cantidad. El Instit[employer] Nacional [employer]ud alienta a las mujeres a comer 5-9 porciones de frutas y verduras cada da. El profesional que lo asiste podr darle instrucciones para ayudarla a mantener un nivel bajo de riesgo de contraer enfermedades cardacas o infarto. La presin arterial elevada causa problemas cardacos y aumenta el riesgo de ictus. La presin arterial debe controlarse cada 1-2 aos, de 20 aos de edad en adelante.   Los anlisis de sangre para el colesterol alto, que causan enfermedades de corazn y los vasos sanguneos, deben comenzar a los 20 aos y repetirse cada 5 aos, si los resultados son normales. (Repetir las pruebas con ms frecuencia si los resultados son altos.)   El control de diabetes consiste en tomar una muestra de sangre para comprobar su nivel de azcar en la sangre, despus de un perodo de ayuno. Esto se hace una vez cada 3 aos, despus de los 45 aos de edad, si los resultados son normales.   La deteccin del cncer de mama es fundamental para el cuidado preventivo de las mujeres. Todas las mujeres de 20 aos o ms deben realizar un autoexamen de los senos cada mes. A la edad de 40 aos o ms, las mujeres deben realizarse un examen de mama cada ao. Entre los 40 y los 50 aos, las mujeres deben realizarse una mamografa (radiografa) de los senos cada ao. El profesional le indicar cundo comenzar   sus mamografas anuales.   El control del cncer cervical incluye un PAP (muestra de clulas que se examinan bajo un microscopio) del cuello uterino (extremo del tero). Tambin incluye la prueba para el VPH (virus del papiloma humano, que puede causar cncer de cuello uterino). El control y examen plvico debe comenzar a los 21 aos, o 3 aos despus de que una mujer sea sexualmente activa. La inspeccin debe  realizarse cada ao, con una prueba de Papanicolaou pero no de VPH, hasta los 30 aos de edad. Despus de los 30 aos de edad, debe hacerse un Papanicolau cada 3 aos con prueba de VPH, si no se ha detectado VPH con anterioridad.   El cncer de colon se puede detectar y con frecuencia prevenir, mucho antes de que sea peligroso para la vida. La mayora de los controles de rutina del cncer de colon comienza a los 50 aos. Sobre una base anual, su mdico puede proporcionar pruebas de fcil utilizacin para llevar a cabo en casa para detectar sangre oculta en las heces. El uso de una pequea cmara en el extremo de un tubo, para examinar directamente el colon (sigmoidoscopia o colonoscopia), puede detectar las primeras formas de cncer de colon y puede salvar la vida. Hable con su mdico acerca de esto a los 50 aos, cuando debe comenzar con la investigacin de rutina. (El control se repite cada 5 aos, a menos que se encuentren formas de plipos pre-cancerosos o tumores pequeos.)   Practicar sexo seguro. Use preservativos. Los condones se utilizan para el control de la natalidad y para reducir la propagacin de las infecciones de transmisin sexual (ETS). El sexo no seguro es la actividad sexual sin proteccin, como preservativos y el evitar realizar actos de alto riesgo, para reducir las probabilidades de contraer o propagar enfermedades de transmisin sexual. Algunas de estas enfermedades son: gonorrea, clamidia, sfilis, tricomonas, herpes, VPH (virus del papiloma humano) y VIH (virus de inmunodeficiencia humana) que causa el virus del SIDA. El herpes, el VIH y el VPH son enfermedades virales que no tienen cura. Pueden producir discapacidad, cncer y hasta la muerte.   El VPH causa cncer del cuello del tero, y otras infecciones que pueden transmitirse de persona a persona Existe una vacuna para el VPH y las mujeres deben recibir la vacuna entre las edades de 11 y 26 aos. Se requiere una serie de tres  inyecciones.   La osteoporosis es una enfermedad en la cual los huesos pierden minerales y fuerza a medida que envejecemos. Esto puede dar lugar a fracturas seas graves. El riesgo de osteoporosis puede identificarse mediante una densitometra sea. Las mujeres de ms de 65 aos deben consultar con su mdico, al igual que las mujeres post menopausia que tienen otros factores de riesgo. Pregunte a su mdico si debe tomar un suplemento de calcio y vitamina D, para reducir la osteoporosis.   La menopausia puede estar asociada con sntomas fsicos y los riesgos. Se encuentre disponible una terapia de reemplazo de hormonas para diminuirlos. Consulte a su mdico para saber si la toma de hormonas es conveniente para usted.   Use una pantalla solar con un factor SPF de 15 o mayor. Aplique protector solar en abundancia y en varias ocasiones durante el da. Permanecer bajo el sol cuando la sombra que ocasiona el sol es ms chica que la persona, significa que est expuesto a una mayor intensidad. Las personas que se broncean con lmpara tienen un riesgo mayor de sufrir cncer de piel. Use gafas de   sol, para proteger sus ojos del dao de la luz solar (que puede acelerar la formacin de cataratas).   Una vez al mes, hgase un examen la piel del cuerpo entero o de revisin, utilizando un espejo para mirar a su espalda. Notifique al profesional que lo asiste si observa modificaciones en lunares, especialmente en su forma, color, si el tamao es mayor que el de una goma de borrar, o aparecen nuevos lunares.   Mantenga siempre en funcionamiento los detectores de monxido de carbono y de humo dentro del hogar. Cambie las bateras cada seis meses, o utilice un modelo que se enchufe en la pared.   Mantngase al da con sus vacunas contra el ttanos y otras vacunas de rutina. Un refuerzo para el ttanos se debe dar cada 10 aos. Asegrese de aplicarse la vacuna contra la gripe cada ao, dado que el 5% -20% de la poblacin de  [place] se enferma debido a la gripe. La composicin de la vacuna contra la gripe cambia cada ao, por lo que ser vacunados una vez no es suficiente. Vacnese en el otoo, antes del pico de la temporada de gripe. La siguiente tabla muestra las vacunas importantes. Otras vacunas a considerar incluyen el virus de la hepatitis A (para prevenir una forma de infeccin del hgado por un virus adquirido de los alimentos), la varicela zoster (el virus que causa el herpes), y Meningococco (contra las bacterias que causan una forma de meningitis).   Lvese los dientes dos veces al da con pasta con fluoruro y utilice hilo dental diariamente. La buena higiene oral previene la caries dental y enfermedades de las encas, que puede ser dolorosa y puede causar otros problemas de salud. Visite a su dentista para una revisin de rutina oral y dental y cuidado preventivo cada 6-12 meses.   El ndice de Mas[employer] Corpor[employer] o IMC es una manera de medir la cantidad de grasa de su cuerpo. Un IMC superior a 27 aumenta el riesgo de enfermedades cardacas, diabetes, hipertensin, infartos y otros problemas relacionados con la obesidad. El mdico puede ayudar a determinar su ndice de masa corporal, y puede desarrollar un programa de ejercicio y una dieta para ayudarle a alcanzar o mantener esta medida en un nivel saludable.   Use cinturn de seguridad cada vez que se encuentra en un vehculo, ya sea como pasajero o conductor, e incluso para los viajes cortos de unos pocos minutos.   Si utiliza bicicleta, use el casco todo el tiempo.  Cuidados preventivos en los mujeres adultas  Controles preventivos  Edad 19-39 Edad 40-64 Edad 65 o ms  Evaluacin de los riesgos para la salud y asesoramiento sobre el estilo de vida     Control de la presin arterial** Cada 1-2 aos Cada 1-2 aos Cada 1-2 aos  Colesterol total con HDL ** Cada 5 aos, comenzando a los 35 aos Cada 5 aos, comenzando a los 20, o con ms frecuencia si el riesgo es alto.  Cada 5 aos hasta los 75 aos, luego opcional  Autoexamen de mamas Cada mes en mujeres mayores de 20 aos. Cada mes Cada mes  Examen clnico de mamas** Cada 3 aos, comenzando a los 20 aos Todos los aos Todos los aos  Mamografa**   Todos los aos comenzando a los 50, opcional desde los 40-49 aos (convrselo con el profesional) Cada 5 aos hasta los 75 aos, luego opcional  Papanicolau** y control de VPH. Cada ao desde los 21 hasta los 29 aos Cada 3 aos a   partir de los 30 hasta los 65 si el VPH es negativo. Opcional; convrselo con el profesional  Sigmoidoscopa flexible** o colonoscopa**.   Cada 5 aos, comenzando a los 50 aos Cada 5 aos hasta los 80 aos, luego opcional  Prueba de sangre oculta en materia fecal   Cada 5 aos, comenzando a los 50 aos Todos los aos hasta los 80 aos, luego opcional  Autoexamen de piel Cada mes Cada mes Cada mes  Vacuna DT (difteria/ttanos) Cada 10 aos Cada 10 aos Cada 10 aos  Vacuna contra la gripe** Todos los aos Todos los aos Todos los aos  Vacuna contra la VPH Una vez entre 11 y los 26 aos de edad.     Vacuna antineumocccica** Opcional Opcional Cada 5 aos  Vacuna contra la hepatitis B** Serie de 3 vacunas. (si no se ha administrado previamente, por lo general se da a los 0, 1 a 2 y 4 a 6 meses).  Consulte con el profesional si no se la ha administrado. Consulte con el profesional si no se la ha administrado.  **La historia familiar y personal de riesgos y enfermedades puede cambiar las recomendaciones del mdico.  Document Released: 02/14/2005 Document Re-Released: 08/01/2009 [employer] Patient Information 2011 [employer]. 

## 2011-02-28 LAB — RH IMMUNE GLOBULIN WORKUP (NOT WOMEN'S HOSP)

## 2012-03-14 ENCOUNTER — Encounter (HOSPITAL_COMMUNITY): Payer: Self-pay | Admitting: *Deleted

## 2012-03-14 ENCOUNTER — Inpatient Hospital Stay (HOSPITAL_COMMUNITY)
Admission: AD | Admit: 2012-03-14 | Discharge: 2012-03-14 | Disposition: A | Discharge status: Home / Self Care | Payer: Medicaid Other | Source: Ambulatory Visit | Attending: Obstetrics & Gynecology | Admitting: Obstetrics & Gynecology

## 2012-03-14 ENCOUNTER — Inpatient Hospital Stay (HOSPITAL_COMMUNITY): Payer: Medicaid Other

## 2012-03-14 DIAGNOSIS — O209 Hemorrhage in early pregnancy, unspecified: Secondary | ICD-10-CM | POA: Insufficient documentation

## 2012-03-14 LAB — CBC WITH DIFFERENTIAL/PLATELET
Eosinophils Relative: 1 % (ref 0–5)
HCT: 38 % (ref 36.0–46.0)
Lymphocytes Relative: 28 % (ref 12–46)
Lymphs Abs: 2.2 10*3/uL (ref 0.7–4.0)
MCV: 84.3 fL (ref 78.0–100.0)
Monocytes Absolute: 0.5 10*3/uL (ref 0.1–1.0)
RBC: 4.51 MIL/uL (ref 3.87–5.11)
WBC: 7.8 10*3/uL (ref 4.0–10.5)

## 2012-03-14 NOTE — MAU Note (Signed)
Patient states she has had a positive pregnancy test at home. States she started having coffee color discharge yesterday and is concerned because she has had two previous miscarriages. Denies any pain.

## 2012-03-14 NOTE — MAU Provider Note (Signed)
History     CSN: [id number]  Arrival date and time: 03/14/12 1215      Chief Complaint  Patient presents with  . Vaginal Bleeding  . Possible Pregnancy   HPI Maria [name] 31 y.o. [id number] date and time: 03/14/12 1215  last menstrual period was 01/26/2012. EDD by LMP 11/01/2012.  6wk 6d gestational age by LMP  Chief Complaint   Patient presents with   .  Vaginal Bleeding   .  Possible Pregnancy    HPI  Patient had a positive pregnancy test at home and is now concerned because she is having a brown discharge. She is fearful she is miscarrying and wondering if she is pregnant. She had discharge 3 days ago after sexual intercourse. She had some cramping postcoital. She is currently not experiencing either of the above symptoms. She denies nausea, vomit, fever or diarrhea. She denies leaking or gush of fluid. She has a history of 2 prior miscarriages. She has not had any vaginal discharge or bleeding since she has been in [facility].   Past Medical History  Diagnosis Date  . Rh incompatibility   . Mucocele of salivary gland   . Gestational diabetes mellitus in pregnancy, diet-controlled   . Blood type, Rh negative   . Abnormal Pap smear     Past Surgical History  Procedure Date  . Cesarean section 05/27/07    CPD    Family History  Problem Relation Age of Onset  . Diabetes Mother   . Diabetes Father   . Diabetes Maternal Grandmother   . Hearing loss Sister     History  Substance Use Topics  . Smoking status: Never Smoker   . Smokeless tobacco: Never Used  . Alcohol Use: No    Allergies: No Known Allergies  Prescriptions prior to admission  Medication Sig Dispense Refill  . prenatal vitamin w/FE, FA (PRENATAL 1 + 1) 27-1 MG TABS Take 1 tablet by mouth daily.          ROS See above HPI Physical Exam   Blood pressure 124/70, pulse 80, temperature 98.2 F (36.8 C), temperature source Oral, resp. rate 16, height 5' 2.5" (1.588 m), weight 60.147 kg (132 lb 9.6  oz), last menstrual period 01/26/2012, SpO2 100.00%.  Physical Exam  Constitutional: She is oriented to person, place, and time. She appears well-developed and well-nourished. No distress.  Cardiovascular: Normal rate, regular rhythm and normal heart sounds.   No murmur heard. Respiratory: Effort normal and breath sounds normal. She has no wheezes. She has no rales. She exhibits no tenderness.  GI: Soft. She exhibits no distension. There is no tenderness.  Genitourinary: Vagina normal. No vaginal discharge found.  Neurological: She is alert and oriented to person, place, and time.  Skin: Skin is warm and dry.  Psychiatric: She has a normal mood and affect.     MAU Course  Procedures 1. UPT- Positive 2.  QUANT- 12543 3. U/S  Assessment and Plan  1. Probable failed intruterine pregnancy per ultrasound  - Patient instructed to come back on Wednesday and have another quant level drawn  - If quant declining she is to get Cytotec prescription and rhogam  - Discussed the above pt with Dr. Marice Potter and she recommended the above plan with the                            exception of the rhogam timing. The patient needed to leave and  did not want to wait for                         medication. Since no active bleeding was visualized, she can have the rhogam on                             Wednesday when she returns. 2. Patient was explained this in detail with the interpretor. She desired the pregnancy and was willing to wait in case her dates are wrong. She will want Cytotec if this is not a viable pregnancy and has had done this prior with two previous pregnancies.   3. She has many questions about why this continues to happen to her. It was explained there are many reasons this can happen and in order to figure out her reason she would have to be seen in the doctor's office to have some test completed. She seemed to understand completely and had no further questions.  4. Discharge home 5. Return on  Wednesday for quant, rhogam and probable Cytotec.  Felix Pacini 03/14/2012, 4:18 PM  I have reviewed the plan of care with the resident and reviewed her consultation with Dr. Marice Potter.  I spoke with the patient and the interpreter to make sure she understood the follow up plan.  Jeani Sow, Rn FNP

## 2012-03-19 ENCOUNTER — Inpatient Hospital Stay (HOSPITAL_COMMUNITY)
Admission: AD | Admit: 2012-03-19 | Discharge: 2012-03-19 | Disposition: A | Discharge status: Hospice | Payer: Medicaid Other | Source: Ambulatory Visit | Attending: Obstetrics & Gynecology | Admitting: Obstetrics & Gynecology

## 2012-03-19 ENCOUNTER — Inpatient Hospital Stay (HOSPITAL_COMMUNITY): Payer: Medicaid Other

## 2012-03-19 DIAGNOSIS — O020 Blighted ovum and nonhydatidiform mole: Secondary | ICD-10-CM

## 2012-03-19 DIAGNOSIS — Z298 Encounter for other specified prophylactic measures: Secondary | ICD-10-CM | POA: Insufficient documentation

## 2012-03-19 DIAGNOSIS — O0289 Other abnormal products of conception: Secondary | ICD-10-CM

## 2012-03-19 DIAGNOSIS — Z2989 Encounter for other specified prophylactic measures: Secondary | ICD-10-CM | POA: Insufficient documentation

## 2012-03-19 MED ORDER — RHO D IMMUNE GLOBULIN 1500 UNIT/2ML IJ SOLN
300.0000 ug | Freq: Once | INTRAMUSCULAR | Status: AC
  Administered 2012-03-19: 300 ug via INTRAMUSCULAR
  Filled 2012-03-19: qty 2

## 2012-03-19 MED ORDER — HYDROCODONE-ACETAMINOPHEN 5-500 MG PO TABS: 1.0000 | ORAL_TABLET | Freq: Four times a day (QID) | ORAL | Status: DC | PRN

## 2012-03-19 MED ORDER — MISOPROSTOL 200 MCG PO TABS: ORAL_TABLET | ORAL | Status: DC

## 2012-03-19 NOTE — MAU Provider Note (Signed)
  History     CSN: [id number]  Arrival date and time: 03/19/12 0904   None     Chief Complaint  Patient presents with  . Follow-up   HPI Maria [name] 31 y.o.  Comes to MAU today for follow up quant.  Was offered Cytotec at her last visit and could not stay to get Cytotec.  Was to return today for repeat quant.  No pain and no bleeding.  OB History    Grav Para Term Preterm Abortions TAB SAB Ect Mult Living   4 2 2  0 2     2      Past Medical History  Diagnosis Date  . Rh incompatibility   . Mucocele of salivary gland   . Gestational diabetes mellitus in pregnancy, diet-controlled   . Blood type, Rh negative   . Abnormal Pap smear     Past Surgical History  Procedure Date  . Cesarean section 05/27/07    CPD    Family History  Problem Relation Age of Onset  . Diabetes Mother   . Diabetes Father   . Diabetes Maternal Grandmother   . Hearing loss Sister     History  Substance Use Topics  . Smoking status: Never Smoker   . Smokeless tobacco: Never Used  . Alcohol Use: No    Allergies: No Known Allergies  No prescriptions prior to admission    Review of Systems  Gastrointestinal: Negative for nausea, vomiting and abdominal pain.  Genitourinary:       No vaginal discharge. No vaginal bleeding. No dysuria.   Physical Exam   Blood pressure 73/57, pulse 67, temperature 98 F (36.7 C), temperature source Oral, resp. rate 16, last menstrual period 01/26/2012, SpO2 100.00%.  Physical Exam  Nursing note and vitals reviewed. Constitutional: She is oriented to person, place, and time. She appears well-developed and well-nourished.  HENT:  Head: Normocephalic.  Eyes: EOM are normal.  Neck: Neck supple.  Musculoskeletal: Normal range of motion.  Neurological: She is alert and oriented to person, place, and time.  Skin: Skin is warm and dry.  Psychiatric: She has a normal mood and affect.    MAU Course  Procedures HGB 12 on 03-14-12. MDM Previous notes  and ultrasound reviewed. Repeat ultrasound done.  Still a gestational sac with no yolk sac and no embryo.  Quants have inappropriate rise.  Consult with Dr. Macon Large to discuss plan of care - cytotec is appropriate if client desires.  Discussed all information with client via in house interpreter.  Client desires Cytotec.  Has used previously and inserted at home herself.  Assessment and Plan  Blighted ovum pregnancy  Plan rx cytotec 800 mcg given rx vicodin given Message sent to clinic to schedule an appointment for follow up in 2 weeks Rhophylac given today.   Maria [name] 03/19/2012, 1:12 PM

## 2012-03-19 NOTE — MAU Note (Signed)
Rhophylac information booklet in Spanish given to the patient while waiting in the lobby for results. Explained with the translator that 1 1/2 hours required to complete this order.

## 2012-03-19 NOTE — MAU Provider Note (Signed)
Attestation of Attending Supervision of Advanced Practitioner (CNM/NP): Evaluation and management procedures were performed by the Advanced Practitioner under my supervision and collaboration.  I have reviewed the Advanced Practitioner's note and chart, and I agree with the management and plan.  Loudon Krakow, MD, FACOG Attending Obstetrician & Gynecologist Faculty Practice, [facility]  

## 2012-03-19 NOTE — MAU Note (Signed)
Patient to MAU for repeat BHCG Denies any pain but dose have a coffee colored discharge.

## 2012-03-21 LAB — RH IG WORKUP (INCLUDES ABO/RH)
Antibody Screen: NEGATIVE
Gestational Age(Wks): 6
Unit division: 0

## 2012-04-02 ENCOUNTER — Encounter: Payer: Self-pay | Admitting: Advanced Practice Midwife

## 2012-04-02 ENCOUNTER — Ambulatory Visit (INDEPENDENT_AMBULATORY_CARE_PROVIDER_SITE_OTHER): Payer: Self-pay | Admitting: Advanced Practice Midwife

## 2012-04-02 VITALS — BP 103/62 | HR 69 | Temp 96.8°F | Ht 62.25 in | Wt 132.4 lb

## 2012-04-02 DIAGNOSIS — O039 Complete or unspecified spontaneous abortion without complication: Secondary | ICD-10-CM

## 2012-04-02 NOTE — Progress Notes (Signed)
  Subjective:    Patient ID: Maria [name], female    DOB: [birth year], 31 y.o.   MRN: [id number]  HPI This is a 31 y.o. female who is Status Post Spontaneous AB on 03/19/12.  She had a gestational sac with no embryo. She was prescribed cytotec and had a day or so of bleeding with resolution of bleeding after that. No further pain.    Review of Systems See HPI    Objective:   Physical Exam  Constitutional: She is oriented to person, place, and time. She appears well-developed and well-nourished. No distress.  Cardiovascular: Normal rate.   Pulmonary/Chest: Effort normal.  Abdominal: Soft. There is no tenderness. There is no rebound and no guarding.  Genitourinary: Vagina normal and uterus normal. No vaginal discharge found.  Musculoskeletal: Normal range of motion.  Neurological: She is alert and oriented to person, place, and time.  Skin: Skin is warm and dry.  Psychiatric: She has a normal mood and affect.      Assessment & Plan:  A:  Status post SAB      Does not desire another pregnancy right now  P:  Discussed different types of contraception      Wants to use condoms and apply for Mirena

## 2012-04-05 ENCOUNTER — Encounter: Payer: Self-pay | Admitting: Advanced Practice Midwife

## 2012-04-05 DIAGNOSIS — O039 Complete or unspecified spontaneous abortion without complication: Secondary | ICD-10-CM | POA: Insufficient documentation

## 2012-04-05 NOTE — Patient Instructions (Signed)
Aborto espontneo  (Miscarriage) El aborto espontneo es la prdida de un beb que no ha nacido (feto) antes de la semana 20 del embarazo. La mayor parte de estos abortos ocurre en los primeros 3 meses. En algunos casos ocurre antes de que la mujer sepa que est embarazada. Tambin se denomina "aborto espontneo" o "prdida prematura del embarazo". El aborto espontneo puede ser una experiencia que afecte emocionalmente a la persona. Converse con su mdico si tiene dudas, cmo es el proceso de duelo, y sobre planes futuros de embarazo.  CAUSAS   Algunos problemas cromosmicos pueden hacer imposible que el beb se desarrolle normalmente. Los problemas con los genes o cromosomas del beb son generalmente el resultado de errores que se producen, por casualidad, cuando el embrin se divide y crece. Estos problemas no se heredan de los padres.  Infeccin en el cuello del tero.   Problemas hormonales.   Problemas en el cuello del tero, como tener un tero incompetente. Esto ocurre cuando los tejidos no son lo suficientemente fuertes como para contener el embarazo.   Problemas del tero, como un tero con forma anormal, los fibromas o anormalidades congnitas.   Ciertas enfermedades crnicas.   No fume, no beba alcohol, ni consuma drogas.   Traumatismos  A veces, la causa es desconocida.  SNTOMAS   Sangrado o manchado vaginal, con o sin clicos o dolor.  Dolor o clicos en el abdomen o en la cintura.  Eliminacin de lquido, tejidos o cogulos grandes por la vagina. DIAGNSTICO  El mdico le har un examen fsico. Tambin le indicar una ecografa para confirmar el aborto. Es posible que se realicen anlisis de sangre.  TRATAMIENTO   En algunos casos el tratamiento no es necesario, si se eliminan naturalmente todos los tejidos embrionarios que se encontraban en el tero. Si el feto o la placenta quedan dentro del tero (aborto incompleto), pueden infectarse, los tejidos que quedan  pueden infectarse y deben retirarse. Generalmente se realiza un procedimiento de dilatacin y curetaje (D y C). Durante el procedimiento de dilatacin y curetaje, el cuello del tero se abre (dilata) y se retira cualquier resto de tejido fetal o placentario del tero.  Si hay una infeccin, le recetarn antibiticos. Podrn recetarle otros medicamentos para reducir el tamao del tero (contraerlo) si hay una mucho sangrado.  Si su sangre es Rh negativa y su beb es Rh positivo, usted necesitar la inyeccin de inmunoglobulina Rh. Esta inyeccin proteger a los futuros bebs de tener problemas de compatibilidad Rh en futuros embarazos. INSTRUCCIONES PARA EL CUIDADO EN EL HOGAR   El mdico le indicar reposo en cama o le permitir realizar actividades livianas. Vuelva a la actividad lentamente o segn las indicaciones de su mdico.  Pdale a alguien que la ayude con las responsabilidades familiares y del hogar durante este tiempo.   Lleve un registro de la cantidad y la saturacin de las toallas higinicas que utiliza cada da. Anote esta informacin   No use tampones. No No se haga duchas vaginales ni tenga relaciones sexuales hasta que el mdico la autorice.   Slo tome medicamentos de venta libre o recetados para calmar el dolor o el malestar, segn las indicaciones de su mdico.   No tome aspirina. La aspirina puede ocasionar hemorragias.   Concurra puntualmente a las citas de control con el mdico.   Si usted o su pareja tienen dificultades con el duelo, hable con su mdico para buscar la ayuda psicolgica que los ayude a enfrentar la prdida   del embarazo. Permtase el tiempo suficiente de duelo antes de quedar embarazada nuevamente.  SOLICITE ATENCIN MDICA DE INMEDIATO SI:   Siente calambres intensos o dolor en la espalda o en el abdomen.  Tiene fiebre.  Elimina grandes cogulos de sangre (del tamao de una nuez o ms) o tejidos por la vagina. Guarde lo que ha eliminado para  que su mdico lo examine.   La hemorragia aumenta.   Observa una secrecin vaginal espesa y con mal olor.  Se siente mareada, dbil, o se desmaya.   Siente escalofros.  ASEGRESE DE QUE:   Comprende estas instrucciones.  Controlar su enfermedad.  Solicitar ayuda de inmediato si no mejora o si empeora. Document Released: 02/14/2005 Document Revised: 11/06/2011 [employer] Patient Information 2013 [employer].  

## 2012-08-11 NOTE — Progress Notes (Signed)
Called pt with Spanish interpreter, Raynelle Fanning, to ask pt if she still wanted to her mirena that she received from the free scholarship.  Pt stated that she is currently not working and would not be able to afford the insertion fee and that is was ok for [place] to send the Mirena back.  I advised pt that when she receives the insertion fee and she still wants to have Mirena we can redo the Free Application again.  Pt stated understanding and did not have any other questions.

## 2012-09-04 IMAGING — US US OB FOLLOW-UP
1 series · 12 of 28 positions shown · non-contrast
Comparison: none

[Series 1: us ob follow up · 12 of 48 slices shown]
[im 2/48]
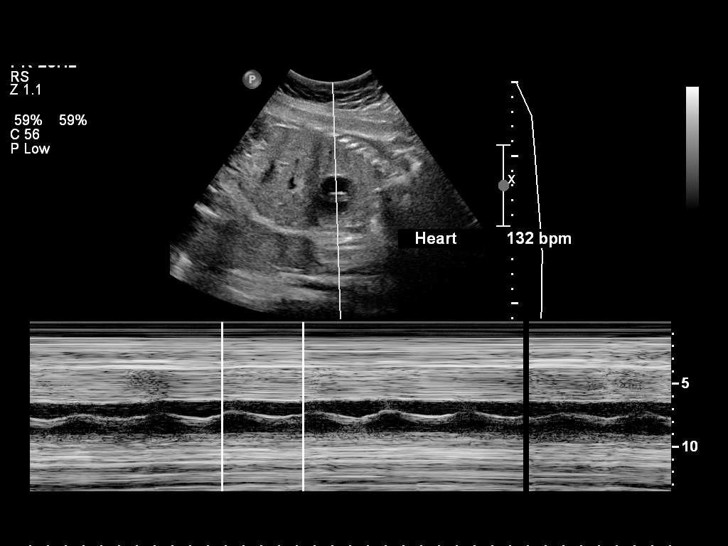
[im 6/48]
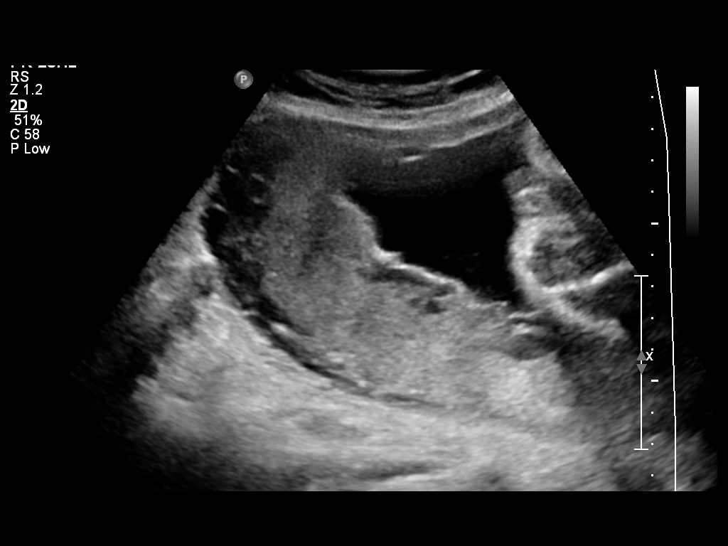
[im 9/48]
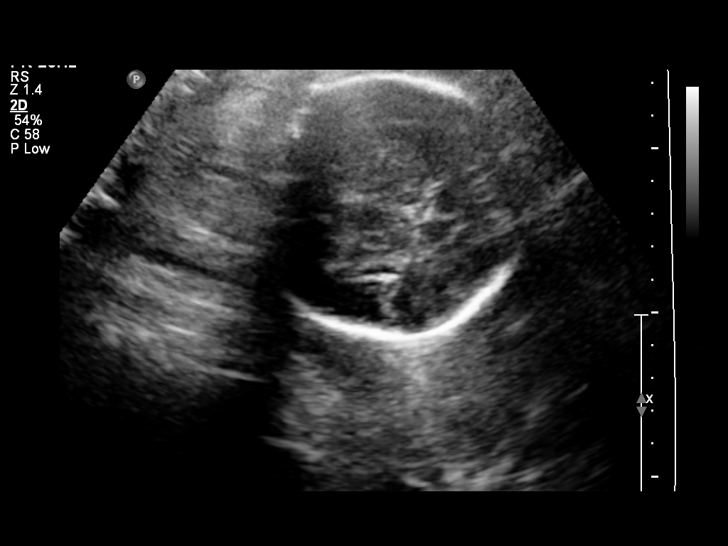
[im 14/48]
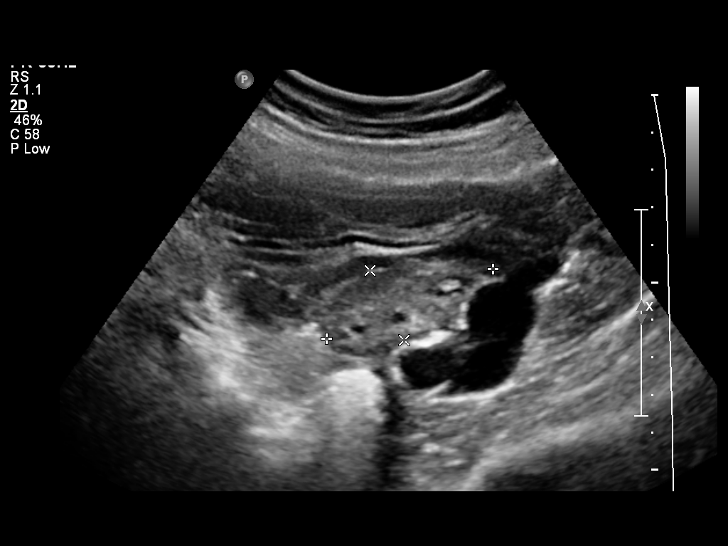
[im 18/48]
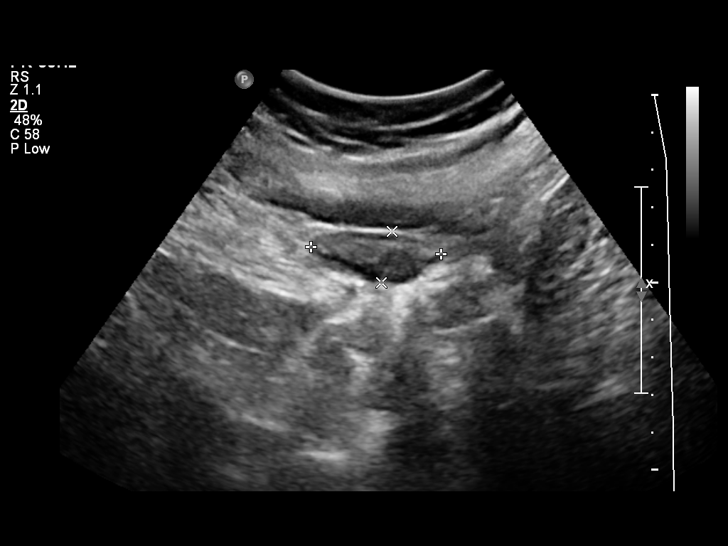
[im 21/48]
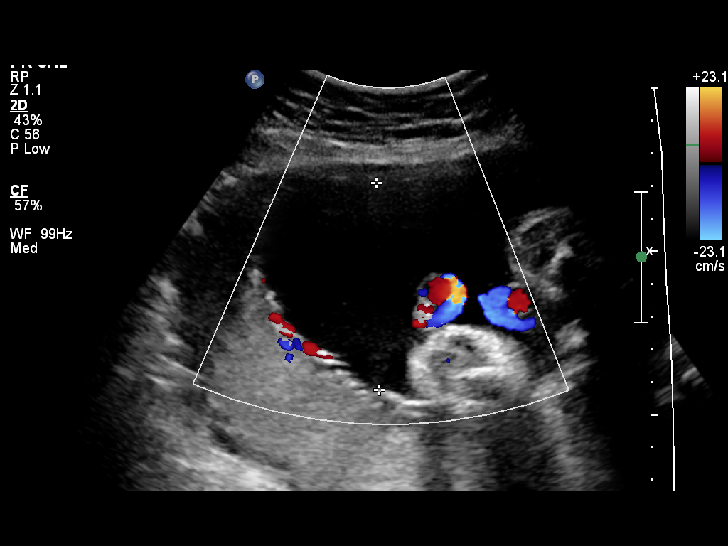
[im 27/48]
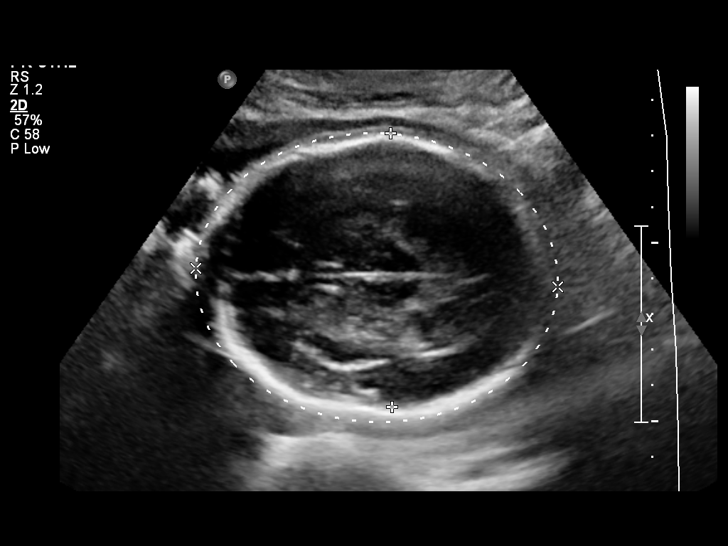
[im 30/48]
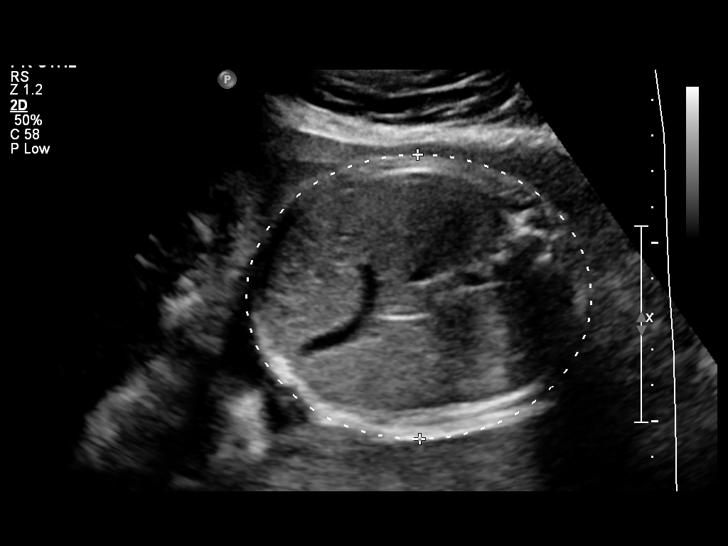
[im 34/48]
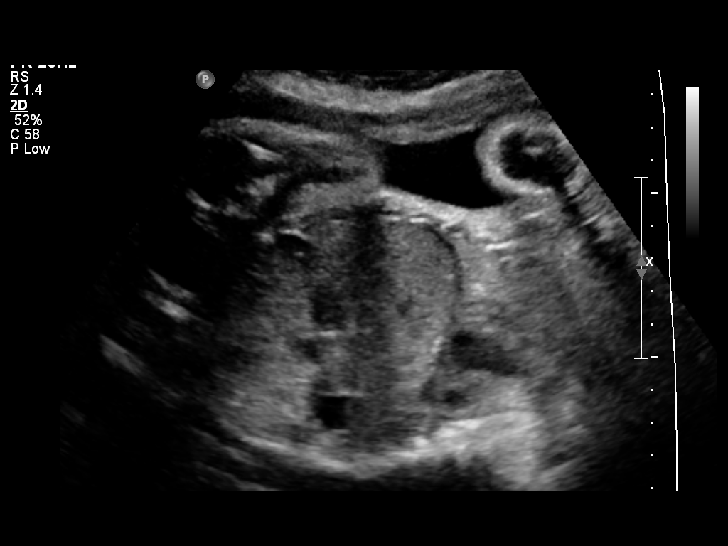
[im 39/48]
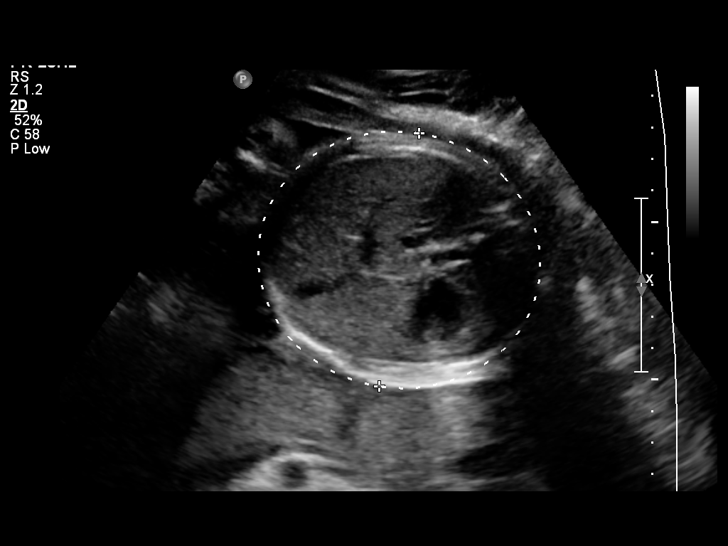
[im 42/48]
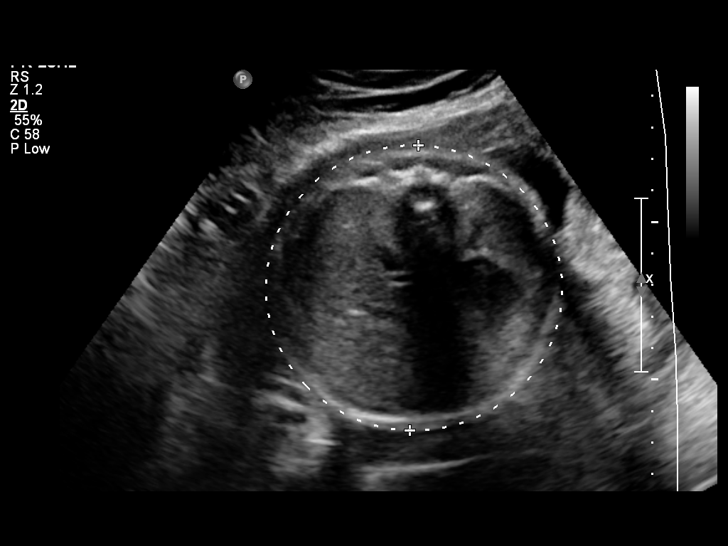
[im 46/48]
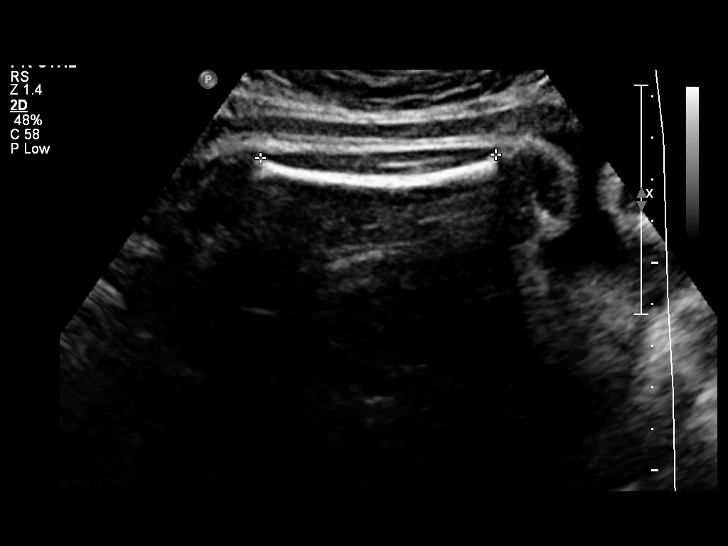

[12 of 28 positions shown; findings below may reference images not displayed]

OBSTETRICS REPORT
                      (Signed Final 10/31/2010 [DATE])

                 [facility]-
                 Faculty Physician
 Order#:         [id number]
Procedures

 US OB FOLLOW UP                                       76816.1
Indications

 Diabetes - Gestational
 Assess Fetal Growth / Estimated Fetal Weight
 Size less than dates (Small for gestational [AGE]
 FGR)
 Previous cesarean section
Fetal Evaluation

 Fetal Heart Rate:  132                          bpm
 Cardiac Activity:  Observed
 Presentation:      Cephalic
 Placenta:          Posterior, above cervical
                    os
 P. Cord            Previously Visualized
 Insertion:

 Amniotic Fluid
 AFI FV:      Subjectively within normal limits
 AFI Sum:     18.55   cm       69  %Tile     Larg Pckt:     6.3  cm
 RUQ:   6.3     cm   RLQ:    4.52   cm    LUQ:   3.31    cm   LLQ:    4.42   cm
Biometry

 BPD:     76.3  mm     G. Age:  30w 4d                CI:        71.56   70 - 86
                                                      FL/HC:      19.8   19.1 -

 HC:     287.2  mm     G. Age:  31w 4d       11  %    HC/AC:      1.05   0.96 -

 AC:     273.9  mm     G. Age:  31w 3d       37  %    FL/BPD:     74.6   71 - 87
 FL:      56.9  mm     G. Age:  29w 6d        3  %    FL/AC:      20.8   20 - 24

 Est. FW:    2669  gm    3 lb 11 oz      37  %
Gestational Age

 LMP:           31w 6d        Date:  03/22/10                 EDD:   12/27/10
 U/S Today:     30w 6d                                        EDD:   01/03/11
 Best:          31w 6d     Det. By:  LMP  (03/22/10)          EDD:   12/27/10
Anatomy

 Cranium:           Appears normal      Aortic Arch:       Previously seen
 Fetal Cavum:       Appears normal      Ductal Arch:       Not well
                                                           visualized
 Ventricles:        Appears normal      Diaphragm:         Appears normal
 Choroid Plexus:    Previously seen     Stomach:           Appears
                                                           normal, left
                                                           sided
 Cerebellum:        Previously seen     Abdomen:           Previously seen
 Posterior Fossa:   Previously seen     Abdominal Wall:    Previously seen
 Nuchal Fold:       Previously seen     Cord Vessels:      Previously seen
 Face:              Previously seen     Kidneys:           Appear normal
 Heart:             Appears normal      Bladder:           Appears normal
                    (4 chamber &
                    axis)
 RVOT:              Previously seen     Spine:             Previously seen
 LVOT:              Previously seen     Limbs:             Previously seen

 Other:     Male gender previously seen Heels previously seen.
Cervix Uterus Adnexa

 Cervical Length:    3.5      cm

 Cervix:       Normal appearance by transabdominal scan.
 Left Ovary:    Within normal limits.
 Right Ovary:   Within normal limits.
 Adnexa:     No abnormality visualized.
Impression

 Assigned GA is currently 31w 6d.   Appropriate fetal growth,
 with EFW at 37 %ile.
 Amniotic fluid within normal limits, with AFI of 18.55 cm.
 Normal cervical length.
 No late developing fetal abnormalities seen involving
 visualized anatomy.

 questions or concerns.

## 2012-10-18 IMAGING — US US OB FOLLOW-UP
1 series · 12 of 28 positions shown · non-contrast
Comparison: none

[Series 1: us ob follow up · 45 acquisitions, 12 frames shown]
[im 2/45]
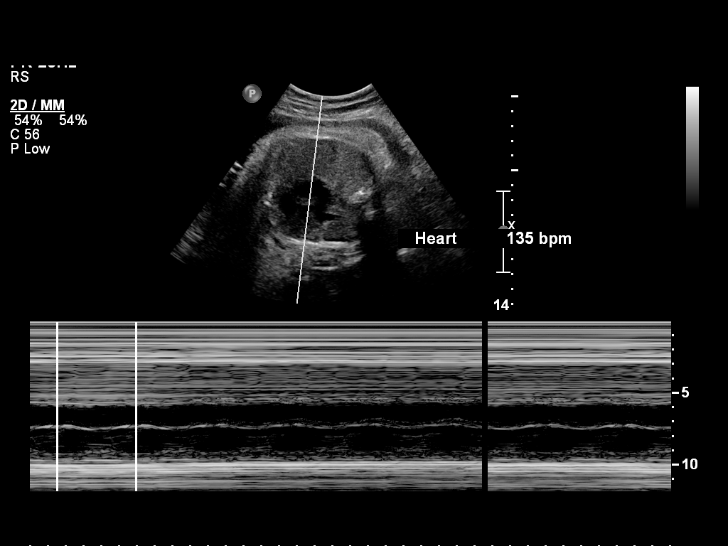
[im 5/45]
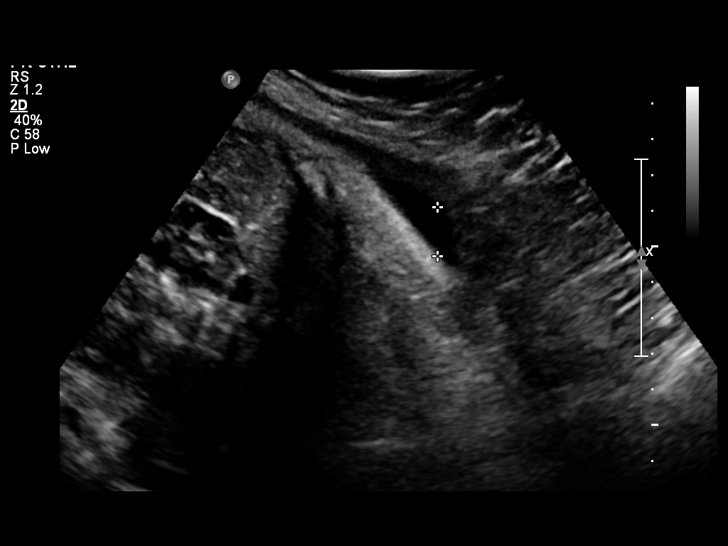
[im 9/45]
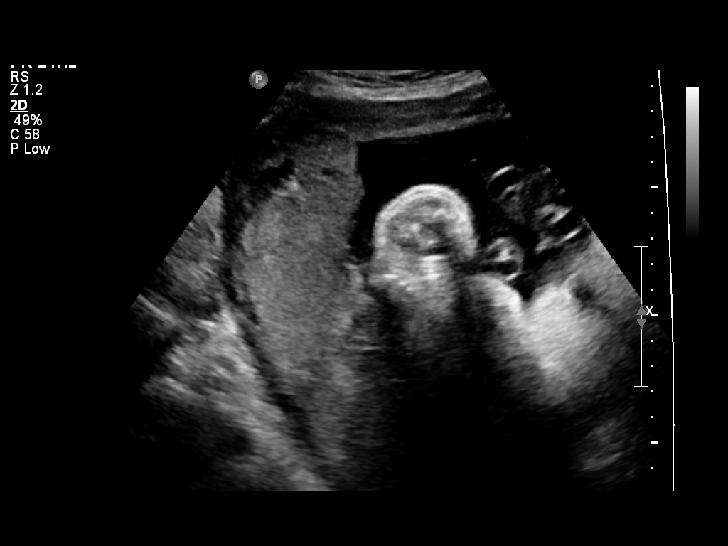
[im 14/45]
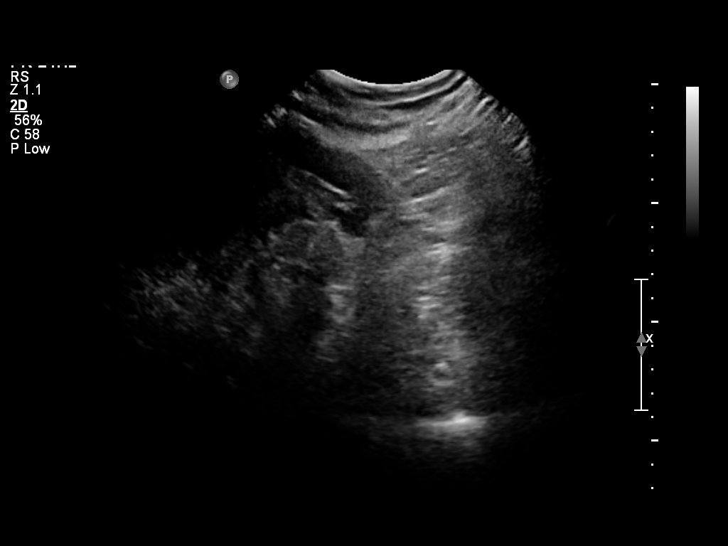
[im 17/45]
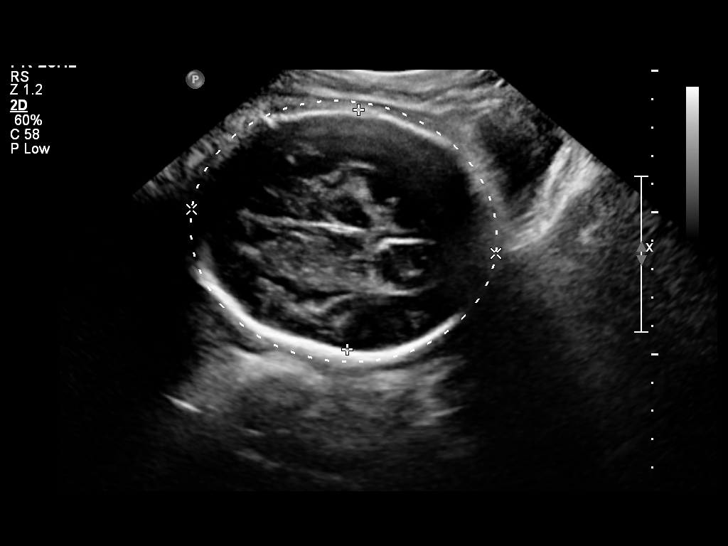
[im 20/45]
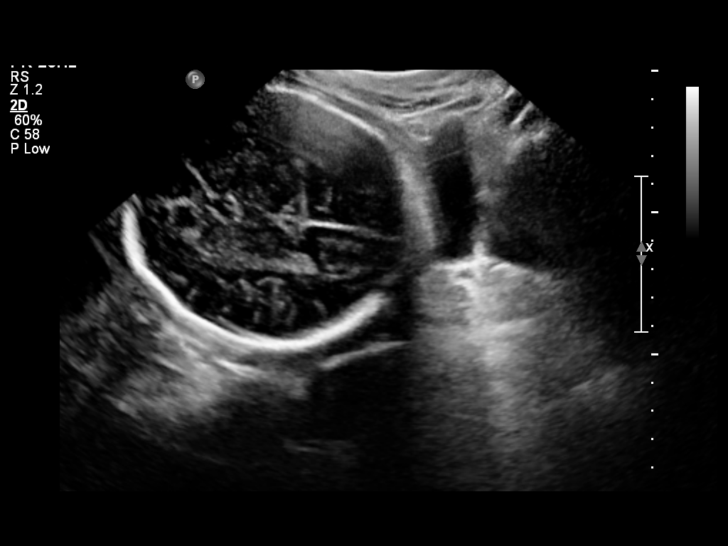
[im 25/45]
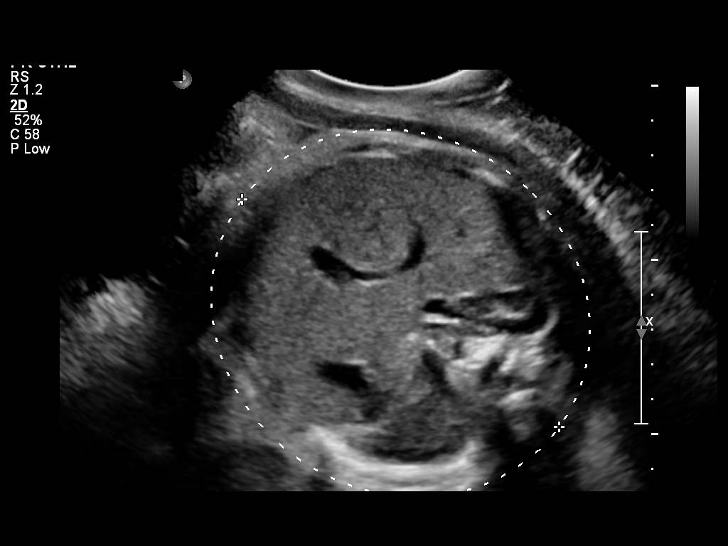
[im 28/45]
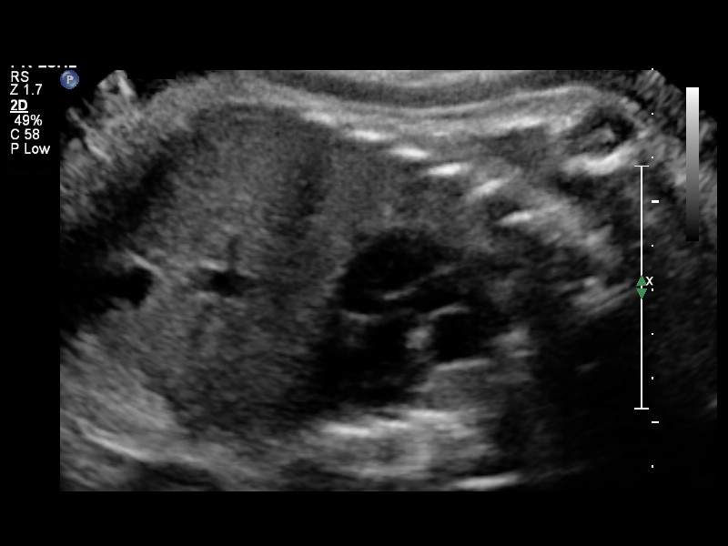
[im 31/45]
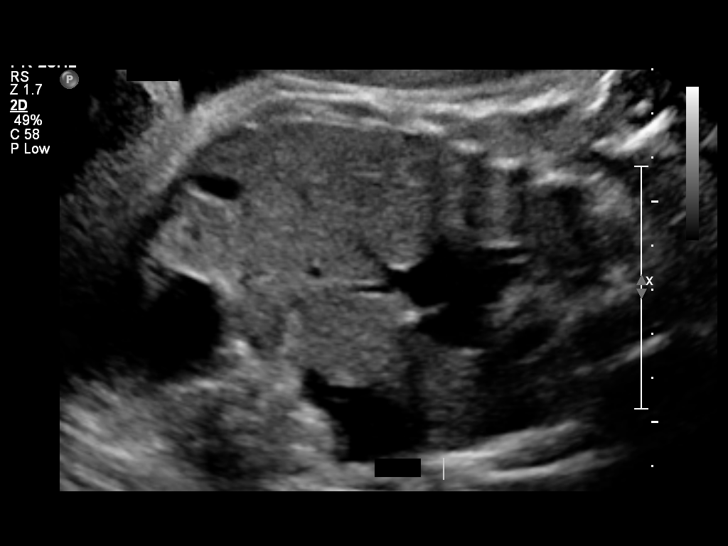
[im 36/45]
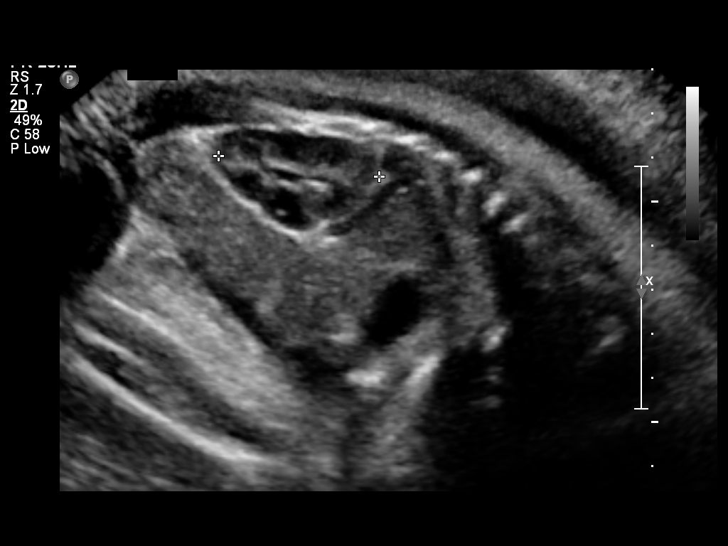
[im 40/45]
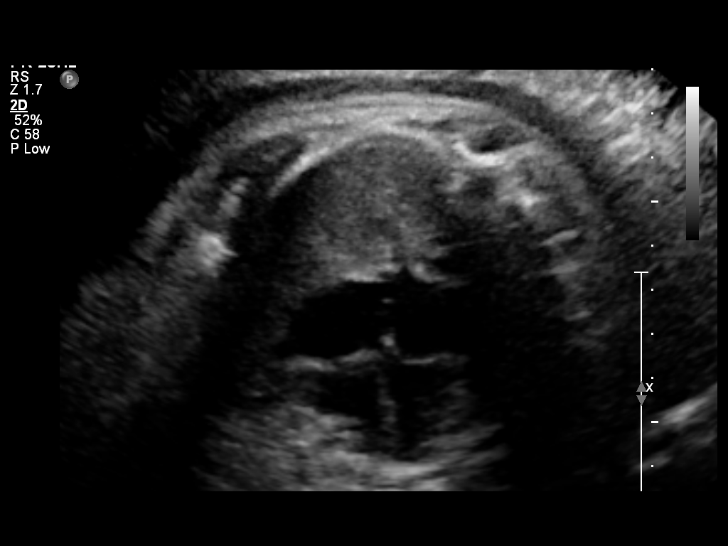
[im 43/45]
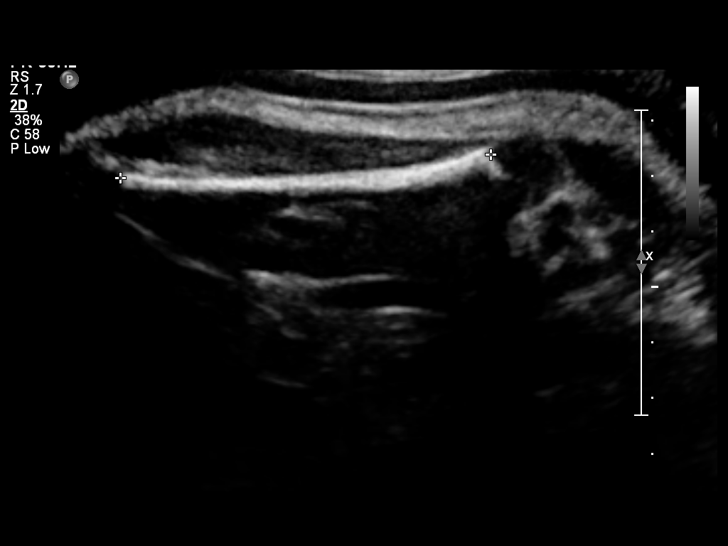

[12 of 28 positions shown; findings below may reference images not displayed]

OBSTETRICS REPORT
                      (Signed Final 12/14/2010 [DATE])

 Order#:         [id number]
Procedures

 US OB FOLLOW UP                                       76816.1
Indications

 Assess Fetal Growth / Estimated Fetal Weight
 Diabetes - Gestational
 Diabetes - Gestational
 Previous cesarean section
Fetal Evaluation

 Fetal Heart Rate:  135                          bpm
 Cardiac Activity:  Observed
 Presentation:      Cephalic
 Placenta:          Posterior, above cervical
                    os
 P. Cord            Previously Visualized
 Insertion:

 Amniotic Fluid
 AFI FV:      Subjectively within normal limits
 AFI Sum:     10.8    cm       32  %Tile     Larg Pckt:    4.44  cm
 RUQ:   2.55    cm   RLQ:    1.37   cm    LUQ:   2.44    cm   LLQ:    4.44   cm
Biometry

 BPD:     84.7  mm     G. Age:  34w 1d                CI:        72.86   70 - 86
                                                      FL/HC:      21.2   20.9 -

 HC:     315.5  mm     G. Age:  35w 3d      < 3  %    HC/AC:      0.94   0.92 -

 AC:     336.6  mm     G. Age:  37w 4d       52  %    FL/BPD:     79.1   71 - 87
 FL:        67  mm     G. Age:  34w 4d      < 3  %    FL/AC:      19.9   20 - 24

 Est. FW:    9098  gm      6 lb 5 oz     33  %
Gestational Age

 LMP:           38w 1d        Date:  03/22/10                 EDD:   12/27/10
 U/S Today:     35w 3d                                        EDD:   01/15/11
 Best:          38w 1d     Det. By:  LMP  (03/22/10)          EDD:   12/27/10
Anatomy

 Cranium:           Appears normal      Aortic Arch:       Previously seen
 Fetal Cavum:       Appears normal      Ductal Arch:       Not well
                                                           visualized
 Ventricles:        Appears normal      Diaphragm:         Appears normal
 Choroid Plexus:    Previously seen     Stomach:           Appears
                                                           normal, left
                                                           sided
 Cerebellum:        Previously seen     Abdomen:           Previously seen
 Posterior Fossa:   Previously seen     Abdominal Wall:    Previously seen
 Nuchal Fold:       Previously seen     Cord Vessels:      Previously seen
 Face:              Previously seen     Kidneys:           Appear normal
 Heart:             Appears normal      Bladder:           Appears normal
                    (4 chamber &
                    axis)
 RVOT:              Previously seen     Spine:             Previously seen
 LVOT:              Previously seen     Limbs:             Previously seen

 Other:     Male gender previously seen Heels previously seen.
Cervix Uterus Adnexa

 Cervix:       Not visualized (advanced GA >34 wks)
 Left Ovary:    Previously seen.
 Right Ovary:   Previously seen

 Adnexa:     No abnormality visualized.
Impression

 Single intrauterine gestation demonstrating an estimated
 gestational age by ultrasound of 35w 3d. This is correlated
 with expected estimated gestational age by LMP of 38w 1d.
 EFW is currently at the 33% as the overall EGA is decreased
 primarily by a small head and femur size with the AC
 maintained. The fetal head size has been downward trending
 and is now < 3%, but no intracranial abnormality is seen
 sonographically. Small HC can be seen with intrauterine
 infection when no signs of overall IUGR are noted.

 No late developing fetal anatomic abnormalities are noted
 associated with the lateral ventricles, four chamber heart,
 stomach, kidneys or bladder.

 Subjectively and quantitatively normal amniotic fluid volume.

 questions or concerns.

## 2013-08-14 ENCOUNTER — Ambulatory Visit: Payer: Self-pay

## 2013-09-07 ENCOUNTER — Encounter: Payer: Self-pay | Admitting: Family Medicine

## 2013-09-07 ENCOUNTER — Ambulatory Visit (INDEPENDENT_AMBULATORY_CARE_PROVIDER_SITE_OTHER): Payer: No Typology Code available for payment source | Admitting: Family Medicine

## 2013-09-07 ENCOUNTER — Ambulatory Visit: Payer: No Typology Code available for payment source

## 2013-09-07 VITALS — BP 118/80 | HR 69 | Temp 98.2°F | Wt 136.0 lb

## 2013-09-07 DIAGNOSIS — R21 Rash and other nonspecific skin eruption: Secondary | ICD-10-CM

## 2013-09-07 DIAGNOSIS — M546 Pain in thoracic spine: Secondary | ICD-10-CM

## 2013-09-07 DIAGNOSIS — O24419 Gestational diabetes mellitus in pregnancy, unspecified control: Secondary | ICD-10-CM

## 2013-09-07 DIAGNOSIS — Z0000 Encounter for general adult medical examination without abnormal findings: Secondary | ICD-10-CM

## 2013-09-07 DIAGNOSIS — O99810 Abnormal glucose complicating pregnancy: Secondary | ICD-10-CM

## 2013-09-07 LAB — CBC WITH DIFFERENTIAL/PLATELET
Basophils Absolute: 0.1 10*3/uL (ref 0.0–0.1)
Basophils Relative: 1 % (ref 0–1)
Eosinophils Absolute: 0.1 10*3/uL (ref 0.0–0.7)
Eosinophils Relative: 1 % (ref 0–5)
HEMATOCRIT: 39.3 % (ref 36.0–46.0)
HEMOGLOBIN: 13.9 g/dL (ref 12.0–15.0)
LYMPHS ABS: 2.1 10*3/uL (ref 0.7–4.0)
LYMPHS PCT: 35 % (ref 12–46)
MCH: 29 pg (ref 26.0–34.0)
MCHC: 35.4 g/dL (ref 30.0–36.0)
MCV: 81.9 fL (ref 78.0–100.0)
MONO ABS: 0.3 10*3/uL (ref 0.1–1.0)
MONOS PCT: 5 % (ref 3–12)
NEUTROS ABS: 3.4 10*3/uL (ref 1.7–7.7)
Neutrophils Relative %: 58 % (ref 43–77)
Platelets: 271 10*3/uL (ref 150–400)
RBC: 4.8 MIL/uL (ref 3.87–5.11)
RDW: 13.1 % (ref 11.5–15.5)
WBC: 5.9 10*3/uL (ref 4.0–10.5)

## 2013-09-07 LAB — TSH: TSH: 2.624 u[IU]/mL (ref 0.350–4.500)

## 2013-09-07 LAB — POCT GLYCOSYLATED HEMOGLOBIN (HGB A1C): HEMOGLOBIN A1C: 5.3

## 2013-09-07 MED ORDER — TERBINAFINE HCL 1 % EX CREA: 1.0000 "application " | TOPICAL_CREAM | Freq: Two times a day (BID) | CUTANEOUS | Status: DC

## 2013-09-07 NOTE — Patient Instructions (Addendum)
Back Pain - most likely due to muscle pain, attempt trial of heat and ice to the area, if you develop a rash please return to the office for follow up  [facility] start Terbinafine (antifungal medication).  Make an appointment for your annual wellness visit/GYN exam in 2-4 weeks. Will discuss lab work at your next visit.

## 2013-09-08 ENCOUNTER — Encounter: Payer: Self-pay | Admitting: Family Medicine

## 2013-09-08 DIAGNOSIS — M546 Pain in thoracic spine: Secondary | ICD-10-CM | POA: Insufficient documentation

## 2013-09-08 LAB — CMP AND LIVER
ALBUMIN: 4.9 g/dL (ref 3.5–5.2)
ALT: 16 U/L (ref 0–35)
AST: 19 U/L (ref 0–37)
Alkaline Phosphatase: 50 U/L (ref 39–117)
BILIRUBIN INDIRECT: 0.5 mg/dL (ref 0.2–1.2)
BUN: 16 mg/dL (ref 6–23)
Bilirubin, Direct: 0.1 mg/dL (ref 0.0–0.3)
CALCIUM: 9.8 mg/dL (ref 8.4–10.5)
CHLORIDE: 104 meq/L (ref 96–112)
CO2: 20 mEq/L (ref 19–32)
Creat: 0.67 mg/dL (ref 0.50–1.10)
GLUCOSE: 88 mg/dL (ref 70–99)
POTASSIUM: 4.1 meq/L (ref 3.5–5.3)
Sodium: 139 mEq/L (ref 135–145)
Total Bilirubin: 0.6 mg/dL (ref 0.2–1.2)
Total Protein: 7.7 g/dL (ref 6.0–8.3)

## 2013-09-08 LAB — LIPID PANEL
CHOL/HDL RATIO: 3.1 ratio
Cholesterol: 186 mg/dL (ref 0–200)
HDL: 60 mg/dL (ref >39)
LDL CALC: 106 mg/dL — AB (ref 0–99)
Triglycerides: 102 mg/dL (ref <150)
VLDL: 20 mg/dL (ref 0–40)

## 2013-09-08 NOTE — Assessment & Plan Note (Signed)
Right paraspinal thoracic pain consistent with MSK etiology -Attempt trial of ice and heat

## 2013-09-08 NOTE — Assessment & Plan Note (Signed)
Patient has history of gestational diabetes. Patient requests to be checked for diabetes today. -Hemoglobin A1c ordered

## 2013-09-08 NOTE — Assessment & Plan Note (Signed)
Hyperpigmented circular rash over the left thumb consistent with fungal infection -Attempt trial of terbinafine

## 2013-09-08 NOTE — Assessment & Plan Note (Signed)
Routine screening lab work ordered for annual health examination -Patient to return in one month for annual well visit exam and GYN exam

## 2013-09-08 NOTE — Progress Notes (Signed)
   Subjective:    Patient ID: Maria [name], female    DOB: 1980-06-10, 33 y.o.   MRN: [id number]  HPI 33 year old female presents for followup of multiple medical conditions. Spanish interpreter is present.  Right upper back pain-patient reports a one-year history of intermittent right upper back pain, she reports no trauma to the area or inciting event, pain is intermittent, no associated rash, no associated hand numbness or weakness, no neck pain, she describes the pain as a burning type sensation, she has not attempted a trial of heat or ice, she has not attempted any over-the-counter pain medications  Rash on left thumb - rash has been present for the past few weeks, is located over her left thumb, she is associated itching, has not attempted any over-the-counter remedy  History of gestational diabetes - patient was diagnosed with gestational diabetes during her last pregnancy in 2012, 2 hour glucose test after pregnancy was within normal limits, patient requests repeat testing for diabetes as she has a family history  Social-nonsmoker, has one son   Review of Systems  Constitutional: Negative for fever and fatigue.  Respiratory: Negative for apnea, cough and shortness of breath.   Cardiovascular: Negative for chest pain.  Gastrointestinal: Negative for nausea, vomiting and diarrhea.       Objective:   Physical Exam Vitals: Reviewed General: Pleasant Hispanic female, no acute distress, accompanied by her son HEENT: Pupils are equal round and reactive to light, extraocular movements are intact, no scleral icterus, moist mucous membranes, uvula midline, neck was supple, no anterior or posterior cervical lymphadenopathy Cardiac: Regular rate and rhythm, S1 and S2 present, no murmurs, no heaves or thrills Respiratory: Clear to patient bilaterally, normal effort MSK: Mild right paraspinal tenderness in the thoracic region, no midline tenderness, neck range of motion was full, bilateral  shoulder and arm range of motion was full Skin: No rash over her back, hyperpigmented circular lesion over her left dorsal thumb consistent with fungal infection Neuro: Cranial nerves II through XII are intact, strength was 5 of 5 in all extremities       Assessment & Plan:  Please see problem specific assessment and plan.

## 2013-10-05 ENCOUNTER — Encounter: Payer: No Typology Code available for payment source | Admitting: Family Medicine

## 2013-10-13 ENCOUNTER — Encounter: Payer: Self-pay | Admitting: Family Medicine

## 2013-10-13 ENCOUNTER — Ambulatory Visit (INDEPENDENT_AMBULATORY_CARE_PROVIDER_SITE_OTHER): Payer: No Typology Code available for payment source | Admitting: Family Medicine

## 2013-10-13 ENCOUNTER — Other Ambulatory Visit (HOSPITAL_COMMUNITY)
Admission: RE | Admit: 2013-10-13 | Discharge: 2013-10-13 | Disposition: A | Discharge status: Home / Self Care | Payer: No Typology Code available for payment source | Source: Ambulatory Visit | Attending: Family Medicine | Admitting: Family Medicine

## 2013-10-13 VITALS — BP 111/74 | HR 66 | Ht 62.5 in | Wt 137.0 lb

## 2013-10-13 DIAGNOSIS — Z0000 Encounter for general adult medical examination without abnormal findings: Secondary | ICD-10-CM

## 2013-10-13 DIAGNOSIS — Z01419 Encounter for gynecological examination (general) (routine) without abnormal findings: Secondary | ICD-10-CM | POA: Insufficient documentation

## 2013-10-13 DIAGNOSIS — M546 Pain in thoracic spine: Secondary | ICD-10-CM

## 2013-10-13 DIAGNOSIS — Z124 Encounter for screening for malignant neoplasm of cervix: Secondary | ICD-10-CM

## 2013-10-13 DIAGNOSIS — Z1151 Encounter for screening for human papillomavirus (HPV): Secondary | ICD-10-CM | POA: Insufficient documentation

## 2013-10-13 NOTE — Patient Instructions (Signed)
Dr. Ree Kida will send you a letter with the results of your PAP smear.   Cuidados preventivos en las mujeres adultas (Preventive Care for Adults, Female) Un estilo de vida saludable y los cuidados preventivos pueden favorecer la salud y [place]. Las pautas de salud preventivas para las mujeres [employer] siguientes prcticas clave:  Un examen fsico de rutina anual es un buen modo de Chief Technology Officer su salud y Optometrist estudios preventivos. [place] de Publishing rights manager preocupaciones y Civil engineer, contracting el estado de su salud, y que le realicen estudios completos.  Consulte al dentista para realizar un examen de rutina y cuidados preventivos cada seis meses. Cepllese los dientes al [employer] veces por da y psese el hilo dental al menos una vez por da. Una buena higiene bucal evita caries y enfermedades de las encas.  La frecuencia con que debe hacerse exmenes de la vista depende de la edad, el estado de [place], la historia familiar, el uso de lentes de contacto y otros factores. Siga las recomendaciones del mdico para saber con qu frecuencia debe hacerse exmenes de la vista.  Consuma una dieta saludable. Los alimentos que [employer], las frutas, los granos enteros, los productos lcteos bajos en grasas y las protenas magras contienen nutrientes que son necesarios, sin consumir Nurse, mental health. Disminuya la ingesta de alimentos ricos en grasas slidas, azcares y sal agregadas. Consuma la cantidad de caloras adecuada para usted. Si es necesario, pdale informacin acerca de [place] a su mdico.  La actividad fsica regular es una de las cosas ms importantes que puede hacer por su salud. Los adultos deben hacer al menos 150 minutos de ejercicios de intensidad moderada (cualquier actividad que aumente la frecuencia cardaca y lo haga transpirar) cada semana. Adems, la State [employer] de los adultos necesita ejercicios de fortalecimiento muscular dos o ms das por [place].  Mantenga  un peso saludable. El ndice de masa corporal [facility]) es una herramienta que identifica posibles problemas con [place]. Proporciona una estimacin de la grasa corporal basndose en el peso y la altura. El mdico podr determinar su [facility] y ayudarlo a Scientist, forensic o Theatre manager un peso saludable. Para los adultos de 20 aos o ms:  Un [facility] menor de 18,5 se considera bajo peso.  Un [facility] entre 18,5 y 24,9 es normal.  Un [facility] entre 25 y 29,9 es sobrepeso.  Un IMC de 30 o ms se considera obesidad.  Mantenga un nivel normal de lpidos y colesterol en sangre practicando actividad fsica y minimizando la ingesta de grasas saturadas. Consuma una dieta balanceada y saludable, e incluya variedad de frutas y vegetales. Los [employer] de lpidos y Research officer, trade union en sangre deben Medical laboratory scientific officer a los 23 aos y repetirse cada 5 aos. Si los niveles de colesterol son altos, tiene ms de 50 aos o tiene riesgo elevado de sufrir enfermedades cardacas, Designer, industrial/product controlarse con ms frecuencia. Si tiene Coca [employer]a de lpidos y colesterol, debe recibir tratamiento con medicamentos, si la dieta y el ejercicio no estn funcionando.  Si fuma, consulte con el mdico acerca de las opciones para dejar de [place]. Si no fuma, no comience.  Se recomienda realizarse exmenes de deteccin de cncer de pulmn a personas adultas entre 19 y 23 aos que estn en riesgo de Horticulturist, commercial de pulmn por sus antecedentes de consumo de tabaco. Para quienes hayan fumado durante 30 aos un paquete diario, y sigan fumando o hayan dejado el hbito en algn momento en los ltimos 15 aos, se recomienda realizarse [place]  tomografa computada de baja dosis de los [employer]. Fumar durante un ao-paquete equivale a fumar un promedio de un paquete de cigarrillos diario durante un ao (por ejemplo: un paquete por da durante [place] paquetes por da durante 15 aos). Los exmenes anuales deben continuar hasta que el fumador haya dejado de fumar  durante un mnimo de 15 aos. Ya no deben Emergency planning/management officer que tengan un problema de salud que les impida recibir tratamiento para el cncer de pulmn.  Si est embarazada, no beba alcohol. Si est amamantando, beba alcohol con prudencia. Si no est embarazada y decide beber alcohol, no beba ms de Naval architect. Se considera una medida a 12onzas (323ml) de cerveza, 5onzas (188ml) de vino, o [id number] (08QP) de licor.  Evite el consumo de drogas. No comparta agujas. Pida ayuda si necesita asistencia o instrucciones con respecto a abandonar el consumo de drogas.  La hipertensin arterial causa enfermedades cardacas y [place] el riesgo de ictus. Debe controlar su presin arterial al menos cada uno o Trainer. La hipertensin arterial que persiste debe tratarse con medicamentos si la prdida de peso y el ejercicio no son efectivos.  Si tiene entre 43 y 44 aos, consulte a su mdico si debe tomar aspirina para prevenir ictus.  Los anlisis para la diabetes incluyen la toma de [place] de sangre para controlar el nivel de azcar en la sangre durante el [place]. Debe hacerlo cada tres aos despus de los 38 aos si est dentro de su peso normal y sin factores de riesgo para la diabetes. Las pruebas deben comenzar a edades tempranas o llevarse a cabo con ms frecuencia si tiene sobrepeso y al menos un factor de riesgo para la diabetes.  Las evaluaciones para Public affairs consultant de mama son un mtodo preventivo fundamental para las mujeres. Debe practicar la "autoconciencia de las mamas". Esto significa que Chief Technology Officer apariencia normal de sus mamas y [place] y pudiendo incluir un autoexamen de Glass blower/designer. Si detecta algn cambio, no importa cun pequeo sea, debe informarlo a su mdico. Las [employer] Foods 20 y 49 aos deben hacerse un examen clnico de las mamas como parte del examen regular de [place], cada uno a tres aos. Despus de los [address], deben Coca[employer]a. A partir  de los [address], deben hacerse una mamografa (radiografa de mamas) cada ao. Las mujeres con antecedentes familiares de cncer de mama deben hablar con el mdico para someterse a un estudio gentico. Las que tienen ms riesgo deben hacerse una resonancia magntica y Lavinia Sharps todos [place].  La evaluacin del riesgo de cncer relacionado con el gen del cncer de mama (BRCA) se recomienda a mujeres que tengan familiares con cncer relacionado con el BRCA. Los cnceres relacionados con el BRCA incluyen el cncer de mama, de ovario, de trompas y peritoneal. Raynelle Jan familiares con estos cnceres puede estar asociado con un mayor riesgo de cambios dainos (mutaciones) en los genes del cncer de mama BRCA1 y BRCA2. Los resultados de la evaluacin determinarn la necesidad de asesoramiento gentico y de [place] de BRCA1 y BRCA2.  Una prueba de Papanicolaou se realiza para diagnosticar cncer de cuello del tero. Muestra los cambios celulares en el cuello del tero que pueden transformarse en cncer si no se tratan. La prueba de Papanicolaou es un procedimiento por el que se obtienen clulas de la parte inferior del tero (cuello del tero) y son examinadas.  Las  mujeres deben hacerse una prueba de Papanicolaou a partir de los 21 aos.  [employer] 21 y los 29 aos debe repetirse [place].  Despus de los 30 aos, debe realizarse una prueba de Papanicolaou cada tres aos siempre que los tres estudios anteriores sean normales.  Algunas mujeres sufren problemas mdicos que aumentan la probabilidad de Museum/gallery curator cncer de cuello del tero. Hable con su mdico sobre [employer]. Es muy importante que le informe a su mdico si aparecen nuevos problemas poco despus de su ltima prueba de Papanicolaou. En estos casos, el mdico podr indicar que se realice esta prueba con ms frecuencia.  Estas recomendaciones son las mismas para todas las mujeres que hayan recibido o no la vacuna para el VPH (virus del  papiloma [place]).  Si le han realizado una histerectoma por un problema que no era cncer u otra enfermedad que podra causar cncer, ya no necesitar la prueba de Papanicolaou. Sin embargo, es una buena idea hacerse un examen regularmente para asegurarse de que no haya otros problemas.  Si tiene entre 50 y 41 aos y ha tenido un resultado normal en la prueba de Papanicolaou en los ltimos 10 aos, no ser ms necesario realizarla. Sin embargo, es una buena idea hacerse un examen regularmente para asegurarse de que no haya otros problemas.  Si ha recibido Lexicographer para Science writer cervical o para una enfermedad que podra causar cncer, necesitar realizarse una prueba de Papanicolaou y controles durante al menos 82 aos de concluido el [place].  Si no se ha Futures trader con regularidad, Chief Executive Officer a evaluarse los factores de riesgo (como el tener un nuevo compaero sexual) para Programmer, applications a Actuary.  La prueba del VPH es un anlisis adicional que puede usarse para Film/video editor de cuello del tero. Esta prueba busca la presencia del virus que causa los cambios en el cuello. Las clulas que se recolectan durante la prueba de Papanicolaou pueden usarse para el VPH. La prueba para el VPH puede usarse para Chief of Staff a mujeres de ms de 58 aos y debe usarse en mujeres de cualquier edad cuyos resultados de la prueba de Papanicolaou no sean claros. Despus de los 30 aos, las mujeres deben hacerse el anlisis para el VPH con la misma frecuencia que la prueba de Papanicolaou.  El cncer colorectal puede detectarse y con frecuencia puede prevenirse. La mayor parte de los estudios de rutina comienzan a los 32 aos y [employer] 56 aos. Sin embargo, el mdico podr aconsejarle que lo haga antes, si tiene factores de riesgo para el cncer de colon. Una vez por ao, el profesional le dar un kit de prueba para Hydrologist en la materia fecal. La  utilizacin de un tubo con una pequea cmara en su extremo para examinar directamente el colon (sigmoidoscopa o colonoscopa), puede detectar formas temprana de cncer colorectal. Hable con su mdico si tiene 3 aos, edad a la que NiSource de [place]. El examen directo del colon debe repetirse cada cinco a 10 aos, hasta los 75 aos, excepto que se encuentren formas tempranas de plipos precancerosos o pequeos bultos.  Las personas con un riesgo mayor de hepatitis B deben realizarse anlisis para Futures trader virus. Se considera que tiene un alto riesgo de hepatitis B si:  Naci en un pas donde la hepatitis B es frecuente. Pregntele a su mdico qu pases son considerados de Public affairs consultant.  Sus padres Sports coach  pas de alto riesgo y usted no recibi una vacuna que proteja contra la hepatitis B (vacuna de la hepatitis B).  Tiene VIH o SIDA.  [place] agujas para inyectarse drogas ilegales.  Vive o tiene sexo con alguien que tiene hepatitis B.  Recibe tratamiento de hemodilisis.  Toma ciertos medicamentos para Nurse, mental health, trasplante de rganos y afecciones autoinmunes.  Se recomienda realizar un anlisis de sangre para Hydrographic surveyor hepatitis C a todas las personas nacidas entre 1945 y 1965, y a todo aquel que tenga un riesgo conocido de haber contrado esta enfermedad.  Practique el sexo seguro. Use condones y evite las prcticas sexuales riesgosas para disminuir el contagio de enfermedades de transmisin sexual. Las enfermedades de transmisin sexual son la gonorrea, clamidia, sfilis, tricomonas, herpes, VPH y el virus de inmunodeficiencia humana (VIH). El herpes, el VIH y [place] VPH son enfermedades virales que no tienen [place]. Pueden producir discapacidad, cncer y [employer]. Las mujeres sexualmente activas de 25 aos o menos deben ser evaluadas para Hydrographic surveyor clamidia. Las Cendant Corporation que tengan mltiples compaeros tambin deben hacerse el anlisis para Hydrographic surveyor  clamidia. Se recomienda realizar anlisis para detectar otras enfermedades de transmisin sexual si es sexualmente [place] y tiene riesgos.  La osteoporosis es una enfermedad en la que los huesos pierden los minerales y la fuerza por el avance de la edad. El resultado pueden ser fracturas o quebraduras graves en [place]. El riesgo de osteoporosis puede identificarse con Ardelia Mems prueba de densidad sea. Las mujeres de ms de 62 aos y las que tengan riesgos de sufrir fracturas u osteoporosis deben pedir consejo a su mdico. Consulte a su mdico si debe tomar un suplemento de calcio o de vitamina D para reducir el riesgo de osteoporosis.  La menopausia se asocia a sntomas y riesgos fsicos. Se dispone de una terapia de reemplazo hormonal para disminuir los sntomas y [place]. Consulte a su mdico para saber si la terapia de reemplazo hormonal es conveniente para usted.  Utilice pantalla solar. Aplique pantalla de Kerry Dory y repetida a lo largo del Training and development officer. Pngase al resguardo del sol cuando la sombra sea ms pequea que usted. Protjase usando mangas y [employer], un sombrero de ala ancha y gafas para el sol todo el ao, siempre que se encuentre en el exterior.  Una vez por mes hgase un examen de la piel de todo el cuerpo usando un espejo para ver la espalda. Informe al mdico si aparecen nuevos lunares, o los que ya tena tienen bordes irregulares, los que sean ms grandes que una goma de lpiz o los que hayan cambiado su forma o color.  Mantngase al da con las vacunas obligatorias (inmunizaciones).  Vacuna antigripal. Todas las personas adultas deben vacunarse cada ao.  Vacuna contra la difteria, ttanos y Advice worker (DT, DTPa). Las mujeres embarazadas deben recibir una dosis de la vacuna DTPa en cada embarazo. Se debe recibir la dosis independientemente de cunto tiempo haya transcurrido desde la ltima dosis. Es preferible vacunarse entre la semana 38 y 61 de la gestacin. Una  persona adulta que no haya recibido la vacuna DTPa anteriormente o que no sabe su estado de vacunacin debe recibir una dosis. Despus de esta dosis inicial, sigue un refuerzo con toxoides de difteria y ttanos (DT) cada 10 aos. Las Ship broker que no sepan o no hayan recibido la serie de vacunacin de tres dosis con las vacunas que contienen DT deben iniciar o finalizar una serie de vacunacin primaria, que  incluye la dosis de DTPa. Las personas adultas deben recibir una dosis de refuerzo de DT cada 10 aos.  Vacuna contra la varicela. Ardelia Mems persona adulta sin prueba de inmunidad a la varicela debe recibir dos dosis o una segunda dosis si recibi una dosis previamente. Las embarazadas sin prueba de inmunidad deben recibir la primera dosis despus del Media planner. Esta primera dosis se debe aplicar antes del alta del centro de salud. La segunda dosis debe aplicarse entre cuatro y [employer] posteriores a la primera dosis.  Vacuna contra el virus del Engineer, technical sales (VPH). Las ConAgra Foods 13 y 65 aos que no hayan recibido la vacuna antes deben recibir la serie de tres dosis. La vacuna no se recomienda en mujeres embarazadas. Sin embargo, no es Chartered loss adjuster una prueba de [place] antes de recibir una dosis. Si se descubre que una mujer est embarazada despus de recibir la dosis, no se Producer, television/film/video. En ese caso, las dosis restantes deben retrasarse hasta despus del embarazo. Se recomienda la vacuna para cualquier persona inmunodeprimida hasta la edad de 26 aos si no recibi [place] o ninguna de las dosis anteriormente. Durante la serie de tres dosis, la segunda dosis debe aplicarse entre las cuatro y ocho semanas posteriores a la primera dosis. La tercera dosis debe aplicarse 24 semanas despus de la primera dosis y 16 semanas despus de la segunda dosis.  Vacuna contra el herpes zoster. Se recomienda una dosis en personas [employer] de 36 aos a menos que sufran ciertas  enfermedades.  [place] contra sarampin, rubola y paperas (SRP) Los adultos nacidos antes de 1957 generalmente se consideran inmunes al sarampin y las paperas. Las Forensic scientist en 1957 o ms tarde deben recibir una o ms dosis de la vacuna SRP, a menos que [employer] Mutual [employer] Omaha contraindicacin para la vacuna o que tengan prueba de inmunidad a las tres enfermedades. Se debe aplicar una segunda dosis de rutina de la vacuna SRP al menos 28das despus de la primera dosis a estudiantes de escuelas postsecundarias, trabajadores de [employer]	[place] o viajeros internacionales. Las personas que recibieron la vacuna inactivada contra el sarampin o algn tipo desconocido de vacuna contra el sarampin Kathleen y 1967 deben recibir dos dosis de la vacuna [place]. Las Advertising copywriter recibieron la vacuna inactivada contra las paperas o algn tipo desconocido de vacuna contra las paperas antes de 1979 y tienen un alto riesgo de infectarse con la enfermedad deben considerar vacunarse con dos dosis de la vacuna SRP. En las mujeres en edad frtil, debe determinarse la inmunidad contra la rubola. Si no hay prueba de inmunidad, las mujeres que no estn embarazadas deben vacunarse. Si no hay prueba de inmunidad, las mujeres que estn embarazadas deben retrasar la vacunacin hasta despus del [place] at [place]. Los trabajadores de KB Home	[place] no vacunados que nacieron antes de 1957 y no tienen prueba de inmunidad contra el sarampin, [place] y paperas o no tienen confirmacin de laboratorio de la enfermedad deben considerar vacunarse contra el sarampin y las paperas con dos dosis de la vacuna [place], y contra la rubola con una dosis de la vacuna SRP.  Vacuna antineumoccica conjugada 13 valente (PCV13). Segn indicacin mdica, una persona que no conozca su historia de vacunacin y no tenga registro de vacunas debe recibir la vacuna PCV13. Una persona de 19 aos o ms que tenga ciertas enfermedades y no se haya vacunado debe recibir una dosis de la  vacuna PCV13. Despus de esta vacuna, se debe aplicar una dosis de la  vacuna antineumoccica de polisacridos (PPSV23). La dosis de la vacuna PPSV23 se debe recibir al menos ocho semanas despus de la dosis de la vacuna PCV13. Una persona de 19 aos o ms que tenga ciertas enfermedades y Recruitment consultant recibido una o ms dosis de la vacuna PPSV23 previamente debe recibir una dosis de la vacuna PCV13. La dosis de la vacuna EVO35 se debe aplicar un ao o ms despus de la dosis de la vacuna PPSV23.  Vacuna antineumoccica de polisacridos (PPSV23). Primero se debe recibir la vacuna PCV13 si tambin est indicada. Todas las personas de 65 aos o mayores deben recibir la vacuna. Una Network engineer de 76 aos que tenga ciertas enfermedades se Teacher, English as a foreign language. Cleora Fleet persona que viva en un hogar de Mining engineer o en un centro de atencin durante mucho tiempo se debe vacunar. Un fumador adulto se Teacher, English as a foreign language. Las personas inmunodeprimidas o con otras enfermedades deben recibir ambas vacunas, PCV13 y PPSV23. Las personas infectadas con el virus de la inmunodeficiencia humana (VIH) deben recibir la vacuna lo antes posible despus del diagnstico. Se debe evitar la vacunacin durante tratamientos de quimioterapia y radioterapia. El uso de rutina de la vacuna PPSV23 no est recomendado para Teacher, early years/pre, personas nativas de [place] o [employer] de 65aos, salvo que tengan ciertas enfermedades que requieran la vacuna. Segn indicacin mdica, las personas que no conozcan su historia de vacunacin y no tengan registros de [place], deben recibir la vacuna PPSV23. Se recomienda una nica revacunacin cinco aos despus de recibir la primera dosis de PPSV23 para personas de 19 a 69 aos con insuficiencia renal crnica, sndrome nefrtico, asplenia o inmunodepresin. Las [place] [employer] Works recibieron de una a dos dosis de PPSV23 antes de los 65 aos deben recibir otra dosis de [place] a los 65 aos de edad o posteriormente si pasaron cinco aos,  como mnimo, desde la dosis anterior. Las dosis de PPSV23 no son necesarias para personas que ya recibieron la vacuna a los 75 aos o posteriormente.  Vacuna antimeningoccica. Los adultos con asplenia o con persistentes deficiencias de componentes terminales del complemento deben recibir dos dosis de la vacuna antimeningoccica conjugada tetravalente (MenACWY-D). Las dosis se deben Midwife con un mnimo de Exelon Corporation de separacin. Deben vacunarse los microbilogos que trabajan con ciertas bacterias meningoccicas, reclutas militares y personas que viajan o viven en pases con una alta tasa de meningitis. Los estudiantes universitarios de Tourist information centre manager la edad de 21 que vivan en una residencia estudiantil deben recibir una dosis si no se aplicaron la vacuna cuando cumplieron o despus de cumplir 16 aos. Las personas que sufren ciertas enfermedades de alto riesgo deben aplicarse una o ms dosis.  Vacuna contra la hepatitis A. Las Advertising copywriter deseen estar protegidas contra esta enfermedad, que sufren ciertas enfermedades de alto riesgo, que trabajan con animales infectados con hepatitis A, que trabajan en los laboratorios de investigacin de hepatitis A, o que viajan o trabajan en pases con una alta tasa de hepatitis A deben recibir la vacuna. Los personas que no fueron vacunadas previamente y [place]o Etienne a tener un contacto cercano con una persona adoptada fuera del pas, deben recibir la vacuna durante los primeros [address] despus de su llegada a los [place] desde un pas con una alta tasa de hepatitis A.  Vacuna contra la hepatitis B. Las [place] [employer] deseen estar protegidas contra esta enfermedad, que sufren ciertas enfermedades de alto riesgo, que puedan estar expuestas a sangre u otros fluidos corporales infecciosos, que tienen contactos familiares  o parejas sexuales con hepatitis B positivo, que sean clientes o trabajadores de ciertos centros de atencin, o que viajan o trabajan en pases con una  alta tasa de hepatitis B deben recibir la vacuna.  Vacuna antihaemophilus influenzae tipo B (Hib). Una persona no vacunada previamente, que sufra de asplenia o de anemia drepanoctica, o que tenga una esplenectoma programada debe recibir una dosis de la vacuna Hib. Independientemente de la vacunacin previa, un paciente trasplantado con clulas madre hematopoyticas debe recibir Ardelia Mems serie de tres dosis, de seis a 12 meses despus del trasplante exitoso. La vacuna Hib no est recomendada para personas adultas infectadas con VIH. Controles preventivos - Frecuencia Ages 78 to 39years  Control de la tensin arterial.**/Cada uno a [employer].  Control de lpidos y colesterol.**Carma Lair cinco aos a partir de los 20 aos.  Examen clnico de Johnson [employer] Johnson.**Carma Lair 3 aos en las Principal Financial 20 y los 17 aos.  Evaluacin del riesgo de cncer relacionado con el BRCA.**/Para mujeres que tienen familiares con cncer relacionado con el BRCA (cncer de mama, de ovario, de trompas y peritoneal).  Prueba de Papanicolaou.**Carma Lair dos [employer] 21 y los 80 aos. Cada tres aos a Proofreader de los 83 aos y [place] 30 o 4 aos, con una historia de tres pruebas de Papanicolaou normales consecutivas.  Pruebas de deteccin de VPH.**/Cada tres aos, a partir de los 21 aos y [place] 46 o 62 aos, con una historia de tres pruebas de Papanicolaou normales consecutivas.  Anlisis de sangre para la hepatitis C.**/Para toda persona con riesgos conocidos de hepatitis C.  Autoexamen de piel. [place] los meses.  Vacuna antigripal. [place] los aos.  Vacuna contra la difteria, ttanos y Advice worker (DT, DTPa).**/Consulte a su mdico. Las mujeres embarazadas deben recibir una dosis de la vacuna DTPa en cada embarazo. Una dosis de DT cada 10 aos.  Vacuna contra la varicela.**/Consulte a su mdico. Las embarazadas sin prueba de inmunidad deben recibir la primera dosis despus del Media planner.  Vacuna contra el virus  del Engineer, technical sales (VPH). [employer] dosis en el curso de seis meses, si tiene 26 aos o menos. La vacuna no se recomienda en mujeres embarazadas. Sin embargo, no es Chartered loss adjuster una prueba de [place] antes de recibir una dosis.  Vacuna contra el sarampin, rubola y paperas (SRP).Marland KitchenEarleen Newport aplicarse al menos una dosis de SRP si ha nacido en 1955 o despus. Podra tambin necesitar una 2.da dosis. En las mujeres en edad frtil, debe determinarse la inmunidad contra la rubola. Si no hay prueba de inmunidad, las mujeres que no estn embarazadas deben vacunarse. Si no hay prueba de inmunidad, las mujeres que estn embarazadas deben retrasar la vacunacin hasta despus del [place].  Vacuna antineumoccica conjugada 13 valente (PCV13).Marland KitchenCecille Amsterdam a su mdico.  Vacuna antineumoccica de polisacridos (PPSV23).**/De una a dos dosis si es fumador o si sufre Actuary.  Vacuna antimeningoccica.**/Si tiene entre 59 y 33 aos y es estudiante universitario de Editor, commissioning que vive en una residencia estudiantil o tiene alguna enfermedad grave, debe recibir Ardelia Mems dosis de esta vacuna. Podra tambin necesitar dosis de refuerzo.  Vacuna contra la hepatitis A.**/Consulte a su mdico.  Vacuna contra la hepatitis B.**/Consulte a su mdico.  Vacuna antihaemophilus influenzae tipo B (Hib).**/Consulte a su mdico. De 40 a 64aos  Control de la tensin arterial.**/Cada uno a [employer].  Control de lpidos y colesterol.**Carma Lair cinco aos a partir de los 20 aos.  Pruebas de deteccin de cncer de pulmn. [place]  los aos si tiene entre 72 y 60 aos, y si ha fumado durante 19 aos un paquete diario y sigue fumando o dej el hbito en algn momento en los ltimos 15 aos. Los estudios de Pharmacologist se interrumpen cuando haya dejado de fumar durante al menos 15aos o si tiene un problema de salud que le impida recibir tratamiento para Science writer de pulmn.  Examen clnico de [employer].**/Todos los ao  despus de los 40 aos.  Evaluacin del riesgo de cncer relacionado con el BRCA.**/Para mujeres que tienen familiares con cncer relacionado con el BRCA (cncer de mama, de ovario, de trompas y peritoneal).  Mamografa.**/Una vez por ao a partir de los 40 aos en adelante siempre que tenga buena salud. Consulte a su mdico.  Prueba de Papanicolaou.Marland KitchenCarma Lair tres aos despus de los 80 aos y [place] 7 o 77 aos, con una historia de tres pruebas de Papanicolaou normales consecutivas.  Pruebas de deteccin de VPH.**/Cada tres aos, a partir de los 67 aos y [place] 70 o 47 aos, con una historia de tres pruebas de Papanicolaou normales consecutivas.  Prueba de sangre oculta en materia fecal. Carma Lair ao a partir de los 50 [employer] 75 aos. No tendr que hacerlo si se realiza una colonoscopa cada 10 aos.  Colonoscopa o sigmoidoscopa flexible.**Carma Lair 5 aos para la sigmoidoscopa flexible o cada 10 aos para la colonoscopa, comenzando a los 55 aos y continuando [facility] 75 aos.  Anlisis de sangre para la hepatitis C.**/Para todas las personas nacidas entre 1945 y 1965, y a todo aquel que tenga un riesgo conocido de haber contrado esta enfermedad.  Autoexamen de piel. [place] los meses.  Vacuna antigripal. [place] los aos.  Vacuna contra la difteria, ttanos y Advice worker (DT, DTPa).**/Consulte a su mdico. Las mujeres embarazadas deben recibir una dosis de la vacuna DTPa en cada embarazo. Una dosis de DT cada 10 aos.  Vacuna contra la varicela.**/Consulte a su mdico. Las embarazadas sin prueba de inmunidad deben recibir la primera dosis despus del Media planner.  Vacuna contra el herpes zoster.Marland KitchenArdelia Mems dosis para adultos de 60 aos o ms.  Vacuna contra el sarampin, rubola y paperas (SRP).Marland KitchenEarleen Newport aplicarse al menos una dosis de SRP si ha nacido en 1957 o despus. Podra tambin necesitar una 2.da dosis. En las mujeres en edad frtil, debe determinarse la inmunidad contra  la rubola. Si no hay prueba de inmunidad, las mujeres que no estn embarazadas deben vacunarse. Si no hay prueba de inmunidad, las mujeres que estn embarazadas deben retrasar la vacunacin hasta despus del [place].  Vacuna antineumoccica conjugada 13 valente (PCV13).Marland KitchenCecille Amsterdam a su mdico.  Vacuna antineumoccica de polisacridos (PPSV23).**/De una a dos dosis si es fumador o si sufre Actuary.  Vacuna antimeningoccica.Marland KitchenCecille Amsterdam a su mdico.  Investment banker, operational contra la hepatitis A.**/Consulte a su mdico.  Vacuna contra la hepatitis B.**/Consulte a su mdico.  Vacuna antihaemophilus influenzae tipo B (Hib).**/Consulte a su mdico. Ms de 74 aos  Control de la tensin arterial.**/Cada uno a International aid/development worker.  Control de lpidos y colesterol.**Carma Lair cinco aos a partir de los 20 aos.  Pruebas de deteccin de cncer de pulmn. /Todos los aos si tiene entre 25 y 38 aos, y si ha fumado durante 84 aos un paquete diario y sigue fumando o dej el hbito en algn momento en los ltimos 15 aos. Los estudios de Pharmacologist se interrumpen cuando haya dejado de fumar durante al menos 15aos o si tiene un problema de [employer]  impida recibir tratamiento para Science writer de pulmn.  Examen clnico de [employer].**/Todos los ao despus de los 40 aos.  Evaluacin del riesgo de cncer relacionado con el BRCA.**/Para mujeres que tienen familiares con cncer relacionado con el BRCA (cncer de mama, de ovario, de trompas y peritoneal).  Mamografa.**/Una vez por ao a partir de los 40 aos en adelante siempre que tenga buena salud. Consulte a su mdico.  Prueba de Papanicolaou.Marland KitchenCarma Lair tres aos despus de los 104 aos y [place] 57 o 25 aos, con una historia de tres pruebas de Papanicolaou normales consecutivas. Las pruebas pueden interrumpirse [employer] 19 y los 34 aos, si tiene tres pruebas de Papanicolaou normales consecutivas y ninguna prueba de Papanicolaou ni de VPH anormal en los  ltimos 10 aos.  Pruebas de deteccin de VPH.**/Cada tres aos, a partir de los 85 aos y [place] 92 o 12 aos, con una historia de tres pruebas de Papanicolaou normales consecutivas. Las pruebas pueden interrumpirse [employer] 23 y los 31 aos, si tiene tres pruebas de Papanicolaou normales consecutivas y ninguna prueba de Papanicolaou ni de VPH anormal en los ltimos 10 aos.  Prueba de sangre oculta en materia fecal. Carma Lair ao a partir de los 50 [employer] 75 aos. No tendr que hacerlo si se realiza una colonoscopa cada 10 aos.  Colonoscopa o sigmoidoscopa flexible.**Carma Lair 5 aos para la sigmoidoscopa flexible o cada 10 aos para la colonoscopa, comenzando a los 86 aos y continuando [facility] 75 aos.  Anlisis de sangre para la hepatitis C.**/Para todas las personas nacidas entre 1945 y 1965, y a todo aquel que tenga un riesgo conocido de haber contrado esta enfermedad.  Pruebas de deteccin de osteoporosis.Marland KitchenWesley Blas nica vez en mujeres de ms de 54 aos que tengan riesgo de fracturas u osteoporosis.  Autoexamen de piel. [place] los meses.  Vacuna antigripal. [place] los aos.  Vacuna contra la difteria, ttanos y Advice worker (DT, DTPa).Marland KitchenArdelia Mems dosis de DT cada 10 aos.  Vacuna contra la varicela.**Cecille Amsterdam a su mdico.  Vacuna contra el herpes zoster.Marland KitchenArdelia Mems dosis para adultos de 60 aos o ms.  Vacuna antineumoccica conjugada 13 valente (PCV13).Marland KitchenCecille Amsterdam a su mdico.  Vacuna antineumoccica de polisacridos (PPSV23).Marland KitchenArdelia Mems dosis para todos los adultos de 65 aos o ms.  Vacuna antimeningoccica.Marland KitchenCecille Amsterdam a su mdico.  Investment banker, operational contra la hepatitis A.**/Consulte a su mdico.  Vacuna contra la hepatitis B.**/Consulte a su mdico.  Vacuna antihaemophilus influenzae tipo B (Hib).**/Consulte a su mdico. ** Los antecedentes familiares y personales de riesgos y enfermedades pueden Quar[place] manager las recomendaciones del mdico. Document Released: 02/14/2005 Document  Revised: 02/25/2013 ExitCare Patient Information 2014 [place], [place].

## 2013-10-14 NOTE — Assessment & Plan Note (Signed)
Patient continues to have paraspinal thoracic pain, most consistent with MSK etiology. No neurologic signs/symptoms. -attempt trial of heat to the area -may use Advil PRN pain

## 2013-10-14 NOTE — Assessment & Plan Note (Signed)
Patient presents for annual preventative and GYN exam. She is up to date on immunizations. Due for PAP smear which is obtained today. Discussed designating a POA. Discussed recent lab work.  -Return to office in one year for annual exam.

## 2013-10-14 NOTE — Progress Notes (Signed)
Patient ID: Maria [name], female   DOB: Jan 03, 1981, 33 y.o.   MRN: [id number] 33 y.o. year old female presents for well woman/preventative visit and annual GYN examination. History obtained with help of spanish interpretor on the phone.   Acute Concerns: Patient is still having burning sensation in upper back, intermittent in nature, currently pain is 6/10, no numbness in upper extremities, cold exacerbates her symptoms, she reports mild relief with Advil, she has not attempted heat/ice to the area  Sexual/Birth History: [id number], currently sexually active with husband  Birth Control:Not currently on birth control, uses condoms with husband  POA/Living Will: No living will/POA  Social:  History   Social History  . Marital Status: Married    Spouse Name: Roxan Hockey    Number of Children: 2  . Years of Education: 9   Occupational History  .     Social History Main Topics  . Smoking status: Never Smoker   . Smokeless tobacco: Never Used  . Alcohol Use: No  . Drug Use: No  . Sexual Activity: Not Currently    Partners: Male    Birth Control/ Protection: None   Other Topics Concern  . None   Social History Narrative   Lives in [place] with husband, Roxan Hockey   Son, [place], [place] 05/27/07   Son, Francesca Jewett, DOB 12/24/2010    Immunization:  Tdap/TD:2012  Influenza:2012  Pneumococcal: Not a candidate  Herpes Zoster: Not a candidate  Cancer Screening:  Pap Smear: ?2012 (no previous history of cervical/uterine/ovarian cancer in the family)  Mammogram: Has never had (no previous history of breast cancer in the family)  Colonoscopy: Not a candidate  Physical Exam: VITALS: Reviewed GEN: pleasant Hispanic female, NAD HEENT:normocephalic, bilateral TM's pearly grey, PERRL, EOMI, nasal septum midline, MMM, uvula midline, no pharyngeal erythema or exudate noted, no thyromegally CARDIAC: RRR, S1 and S2 present, no murmurs, no heaves/thrills RESP: CTAB,  normal effort BREAST:Exam performed in the presence of a chaperone. No masses, no nipple discharge, no axillary lymphadenopathy ABD: soft, no tenderness, normal bowel sounds, no scars GU/GYN:Exam performed in the presence of a chaperone. Normal external anatomy, small nabothian cyst identified on the cervix at the 11 oclock position, normal appearing cervix, PAP smear obtained, bimanual examination was unremarkable EXT: no edema, 2+ radial pulses SKIN: no rash MSK: mild thoracic paraspinal tenderness  ASSESSMENT & PLAN: 33 y.o. female presents for annual well woman/preventative exam and GYN exam. Please see problem specific assessment and plan.

## 2013-10-16 ENCOUNTER — Encounter: Payer: Self-pay | Admitting: Family Medicine

## 2014-03-22 ENCOUNTER — Encounter: Payer: Self-pay | Admitting: Family Medicine

## 2014-04-09 ENCOUNTER — Ambulatory Visit (INDEPENDENT_AMBULATORY_CARE_PROVIDER_SITE_OTHER): Payer: Self-pay | Admitting: Family Medicine

## 2014-04-09 ENCOUNTER — Encounter: Payer: Self-pay | Admitting: Family Medicine

## 2014-04-09 VITALS — BP 110/66 | HR 73 | Temp 98.5°F | Ht 63.0 in | Wt 139.4 lb

## 2014-04-09 DIAGNOSIS — R21 Rash and other nonspecific skin eruption: Secondary | ICD-10-CM

## 2014-04-09 MED ORDER — HYDROCORTISONE 0.5 % EX CREA: 1.0000 "application " | TOPICAL_CREAM | Freq: Two times a day (BID) | CUTANEOUS | Status: DC

## 2014-04-09 MED ORDER — LORATADINE 10 MG PO TABS
10.0000 mg | ORAL_TABLET | Freq: Every day | ORAL | Status: DC
Start: 2014-04-09 — End: 2014-08-16

## 2014-04-09 NOTE — Progress Notes (Signed)
   HPI  Patient presents today for skin rash. Patient is a 33 year old Latino female who presents today with a skin rash which is lasted for approximately 2 weeks. She states that the rash is primarily localized to her arms and forearms but also extends to her abdomen flank and back. This is not pruritic. She states that the rash feels very needlelike. She began taking Benadryl approximately 2 days after the onset of this rash. This was only minimally beneficial. She does not used any other agents at this time.  Of note: Patient states that she and her husband recently purchased a new dog approximately two months ago. She spends a lot of time with this new pet.  Patient denies any recent fever chills headache nausea vomiting diarrhea sore throat cough congestion dizziness vertigo abdominal pain chest pain.  Smoking status noted ROS: Per HPI  Objective: BP 110/66 mmHg  Pulse 73  Temp(Src) 98.5 F (36.9 C) (Oral)  Ht 5\' 3"  (1.6 m)  Wt 139 lb 6.4 oz (63.231 kg)  BMI 24.70 kg/m2  LMP 03/11/2014 Gen: NAD, alert, cooperative with exam HEENT: NCAT, EOMI, PERRL CV: RRR, good S1/S2, no murmur Resp: CTABL, no wheezes, non-labored Abd: SNTND, BS present, no guarding or organomegaly Ext: No edema, warm; countless 2-3 mm raised papules over the flexor surfaces of her upper extremities as well as her abdomen. Rash is non-erythematous, non-scaly, non-blistering no pustules, flesh-colored. No evidence of rash on her palms. Neuro: Alert and oriented, No gross deficits  Assessment and plan:  Rash and nonspecific skin eruption Patient has a 2 week history of a nonspecific rash over the flexor surfaces of her upper extremities. Rash is nonerythematous, nonscaly, non-blistering, no pustules, flesh-colored, palms sparing. Papules are to 3 mm in diameter and raised. Greatest concentration on the proximal arm laterally.  Patient and family recently purchased a new dog proximally 2 months ago. She states  that she spends a large amount of time with this new pattern. She does most of the bathing of this animal. She denies any recent changes in laundry detergent, new aerosol sprays, new candles, new lotions, shampoos, soaps.  I believe that rash is secondary to reaction to new pet dander. At this time I believe it is that patient keep an eye on any symptoms which could represent a red flag.  I have written for loratadine 10 mg daily. As well as hydrocortisone cream.  I've asked patient to follow-up in 3-4 weeks if she does not experience any symptomatic relief. I've also asked her to follow-up with us if this rash worsens in any way.    No orders of the defined types were placed in this encounter.    Meds ordered this encounter  Medications  . loratadine (CLARITIN) 10 MG tablet    Sig: Take 1 tablet (10 mg total) by mouth daily.    Dispense:  30 tablet    Refill:  5  . hydrocortisone cream 0.5 %    Sig: Apply 1 application topically 2 (two) times daily.    Dispense:  30 g    Refill:  1     Kathee DeltonIan D Alixander Rallis, MD,MS,  PGY1 04/09/2014 5:38 PM

## 2014-04-09 NOTE — Patient Instructions (Signed)
It was a pleasure seeing you today in our clinic. Today we discussed your rash. Here is the treatment plan we have discussed and agreed upon together:   - I have written a prescription for Claritin. This is to be taken once every day. - I have written a prescription for hydrocortisone cream. Please apply this to the affected areas 2-4 times a day as needed. - If your rash does not dissipate over the course of the next 2-4 weeks please call to reschedule an appointment to see either myself or Dr. flood key your primary care provider.

## 2014-04-09 NOTE — Progress Notes (Signed)
Due to language barrier, an interpreter was used.  Interpreter name or [employer] Interpreter ID #--Marlene Yemenorway with [employer], Reason for Lubrizol Corporationencounter--rash.  Altamese Dilling~Jeannette Richardson, BSN, RN-BC

## 2014-04-09 NOTE — Assessment & Plan Note (Signed)
Patient has a 2 week history of a nonspecific rash over the flexor surfaces of her upper extremities. Rash is nonerythematous, nonscaly, non-blistering, no pustules, flesh-colored, palms sparing. Papules are to 3 mm in diameter and raised. Greatest concentration on the proximal arm laterally.  Patient and family recently purchased a new dog proximally 2 months ago. She states that she spends a large amount of time with this new pattern. She does most of the bathing of this animal. She denies any recent changes in laundry detergent, new aerosol sprays, new candles, new lotions, shampoos, soaps.  I believe that rash is secondary to reaction to new pet dander. At this time I believe it is that patient keep an eye on any symptoms which could represent a red flag.  I have written for loratadine 10 mg daily. As well as hydrocortisone cream.  I've asked patient to follow-up in 3-4 weeks if she does not experience any symptomatic relief. I've also asked her to follow-up with us if this rash worsens in any way.

## 2014-04-26 ENCOUNTER — Ambulatory Visit: Payer: Self-pay

## 2014-04-29 ENCOUNTER — Encounter: Payer: Self-pay | Admitting: Family Medicine

## 2014-04-29 NOTE — Progress Notes (Signed)
Patient requested Dental Referral. Please follow up with Patient (Spanish)

## 2014-04-30 NOTE — Progress Notes (Signed)
Note to clinical staff - please call patient and inform her that our office does not make dental referrals, I have placed a list of dental providers in an envelope at that front desk that she may pick up.

## 2014-08-16 ENCOUNTER — Ambulatory Visit (INDEPENDENT_AMBULATORY_CARE_PROVIDER_SITE_OTHER): Payer: Self-pay | Admitting: Family Medicine

## 2014-08-16 ENCOUNTER — Encounter: Payer: Self-pay | Admitting: Family Medicine

## 2014-08-16 VITALS — BP 112/79 | HR 76 | Temp 99.0°F | Ht 63.0 in | Wt 140.2 lb

## 2014-08-16 DIAGNOSIS — O24419 Gestational diabetes mellitus in pregnancy, unspecified control: Secondary | ICD-10-CM

## 2014-08-16 DIAGNOSIS — O360190 Maternal care for anti-D [Rh] antibodies, unspecified trimester, not applicable or unspecified: Secondary | ICD-10-CM

## 2014-08-16 DIAGNOSIS — D649 Anemia, unspecified: Secondary | ICD-10-CM

## 2014-08-16 LAB — CBC
HCT: 39.8 % (ref 36.0–46.0)
HEMOGLOBIN: 13.8 g/dL (ref 12.0–15.0)
MCH: 29.3 pg (ref 26.0–34.0)
MCHC: 34.7 g/dL (ref 30.0–36.0)
MCV: 84.5 fL (ref 78.0–100.0)
MPV: 9.7 fL (ref 8.6–12.4)
Platelets: 267 10*3/uL (ref 150–400)
RBC: 4.71 MIL/uL (ref 3.87–5.11)
RDW: 13.5 % (ref 11.5–15.5)
WBC: 5.8 10*3/uL (ref 4.0–10.5)

## 2014-08-16 LAB — COMPREHENSIVE METABOLIC PANEL
ALBUMIN: 4.8 g/dL (ref 3.5–5.2)
ALT: 14 U/L (ref 0–35)
AST: 17 U/L (ref 0–37)
Alkaline Phosphatase: 47 U/L (ref 39–117)
BUN: 15 mg/dL (ref 6–23)
CHLORIDE: 104 meq/L (ref 96–112)
CO2: 24 meq/L (ref 19–32)
CREATININE: 0.66 mg/dL (ref 0.50–1.10)
Calcium: 9.2 mg/dL (ref 8.4–10.5)
Glucose, Bld: 89 mg/dL (ref 70–99)
Potassium: 4.2 mEq/L (ref 3.5–5.3)
Sodium: 138 mEq/L (ref 135–145)
TOTAL PROTEIN: 7.5 g/dL (ref 6.0–8.3)
Total Bilirubin: 0.7 mg/dL (ref 0.2–1.2)

## 2014-08-16 NOTE — Progress Notes (Signed)
   Subjective:    Patient ID: Maria [name], female    DOB: 1980-10-26, 34 y.o.   MRN: [id number]  HPI 34 y/o female presents for routine follow up.  History of gestational DM - no current polyuria/polydipsia/polyphagia, work up after previous pregnancy negative for diabetes, patient wishes to have recheck of blood sugar  Patient is concerned about possible anemia, states that other family members have noted that her skin is yellow, no paleness, LMP 08/11/13, regular periods, last 4-5 day, no heavy bleeding, no spotting in between periods, no other bleeding or bruising noted  RH - history, previously received rhogam, wants to know if she needs this shot prior to her next pregnancy to prevent miscarriage, she is currently attempting to become pregnant, seen by Dr. Gaynell FaceMarshall for last pregnancy   Review of Systems  Constitutional: Negative for fever, chills and fatigue.  Respiratory: Negative for cough and shortness of breath.   Cardiovascular: Negative for chest pain.  Gastrointestinal: Negative for nausea, vomiting and diarrhea.       Objective:   Physical Exam Vitals: reviewed Gen: pleasant Hispanic female, Interpretor present HEEN: normocephalic, PERRL, EOMI, no scleral icterus, MMM Cardiac: RRR, S1 and S2 present, no murmurs, no heaves/thrills Resp: CTAB, normal effort  Reviewed lab work from 08/2013     Assessment & Plan:  Please see problem specific assessment and plan.

## 2014-08-16 NOTE — Assessment & Plan Note (Signed)
Patient reports history of anemia. Patient asymptomatic. Requests lab work today -cbc ordered -reviewed previous lab work which has been negative for anemia.

## 2014-08-16 NOTE — Patient Instructions (Signed)
Dr. Randolm IdolFletke will call you with your results.   It was nice to see you today.

## 2014-08-16 NOTE — Assessment & Plan Note (Signed)
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 Concerned that she needs rhogam prior to next pregnancy. -counseled her that this medication is only given during pregnancy, if she becomes pregnant she will need to be followed in high risk OB clinic

## 2014-08-16 NOTE — Assessment & Plan Note (Signed)
History of gestational diabetes. Patient remains asymptomatic. -check fasting blood sugar today

## 2014-08-17 ENCOUNTER — Encounter: Payer: Self-pay | Admitting: Family Medicine

## 2014-09-02 ENCOUNTER — Telehealth: Payer: Self-pay | Admitting: Family Medicine

## 2014-09-02 NOTE — Telephone Encounter (Signed)
Patient asked about Lab test results from March 28. Please, follow up with Patient (Spanish).

## 2014-09-03 NOTE — Telephone Encounter (Signed)
Discuss negative lab results with patient.

## 2015-02-10 ENCOUNTER — Inpatient Hospital Stay (HOSPITAL_COMMUNITY): Payer: Self-pay

## 2015-02-10 ENCOUNTER — Encounter (HOSPITAL_COMMUNITY): Payer: Self-pay | Admitting: *Deleted

## 2015-02-10 ENCOUNTER — Inpatient Hospital Stay (HOSPITAL_COMMUNITY)
Admission: AD | Admit: 2015-02-10 | Discharge: 2015-02-10 | Disposition: A | Discharge status: Home / Self Care | Payer: Self-pay | Source: Ambulatory Visit | Attending: Obstetrics & Gynecology | Admitting: Obstetrics & Gynecology

## 2015-02-10 DIAGNOSIS — O43891 Other placental disorders, first trimester: Secondary | ICD-10-CM

## 2015-02-10 DIAGNOSIS — O200 Threatened abortion: Secondary | ICD-10-CM

## 2015-02-10 DIAGNOSIS — O26891 Other specified pregnancy related conditions, first trimester: Secondary | ICD-10-CM | POA: Insufficient documentation

## 2015-02-10 DIAGNOSIS — Z6791 Unspecified blood type, Rh negative: Secondary | ICD-10-CM

## 2015-02-10 DIAGNOSIS — O209 Hemorrhage in early pregnancy, unspecified: Secondary | ICD-10-CM

## 2015-02-10 DIAGNOSIS — Z3A01 Less than 8 weeks gestation of pregnancy: Secondary | ICD-10-CM | POA: Insufficient documentation

## 2015-02-10 LAB — CBC
HEMATOCRIT: 39 % (ref 36.0–46.0)
HEMOGLOBIN: 13.5 g/dL (ref 12.0–15.0)
MCH: 29.3 pg (ref 26.0–34.0)
MCHC: 34.6 g/dL (ref 30.0–36.0)
MCV: 84.6 fL (ref 78.0–100.0)
Platelets: 257 10*3/uL (ref 150–400)
RBC: 4.61 MIL/uL (ref 3.87–5.11)
RDW: 12.6 % (ref 11.5–15.5)
WBC: 10.2 10*3/uL (ref 4.0–10.5)

## 2015-02-10 LAB — HCG, QUANTITATIVE, PREGNANCY: hCG, Beta Chain, Quant, S: 8261 m[IU]/mL — ABNORMAL HIGH (ref <5)

## 2015-02-10 MED ORDER — RHO D IMMUNE GLOBULIN 1500 UNIT/2ML IJ SOSY
300.0000 ug | PREFILLED_SYRINGE | Freq: Once | INTRAMUSCULAR | Status: AC
  Administered 2015-02-10: 300 ug via INTRAMUSCULAR
  Filled 2015-02-10: qty 2

## 2015-02-10 NOTE — MAU Provider Note (Signed)
History     CSN: [id number]  Arrival date and time: 02/10/15 1103   First Provider Initiated Contact with Patient 02/10/15 1151      Chief Complaint  Patient presents with  . Vaginal Bleeding   HPI   Ms.Maria [name] is a 34 y.o. female [phone] at [redacted]w[redacted]d presenting to MAU with vaginal bleeding. The bleeding started 2 days ago and has gotten heavier over the course of 2 days.   She was seen 2 days ago with spotting and had an US done at another facility. She is here today because the bleeding has worsened.  The bleeding is described as heavy "like I'm about to start my period."  She has not started prenatal care and is unsure of where she plans to go.    Pain 0/10  OB History    Gravida Para Term Preterm AB TAB SAB Ectopic Multiple Living   0 Past Medical History  Diagnosis Date  . Rh incompatibility   . Mucocele of salivary gland   . Gestational diabetes mellitus in pregnancy, diet-controlled   . Blood type, Rh negative   . Abnormal Pap smear     Past Surgical History  Procedure Laterality Date  . Cesarean section  05/27/07    CPD    Family History  Problem Relation Age of Onset  . Diabetes Mother   . Diabetes Father   . Diabetes Maternal Grandmother   . Hearing loss Sister     Social History  Substance Use Topics  . Smoking status: Never Smoker   . Smokeless tobacco: Never Used  . Alcohol Use: No    Allergies: No Known Allergies  Prescriptions prior to admission  Medication Sig Dispense Refill Last Dose  . Prenatal Vit-Fe Fumarate-FA (MULTIVITAMIN-PRENATAL) 27-0.8 MG TABS tablet Take 1 tablet by mouth daily at 12 noon.   02/10/2015 at Unknown time   Results for orders placed or performed during the hospital encounter of 02/10/15 (from the past 48 hour(s))  Rh IG workup (includes ABO/Rh)     Status: None (Preliminary result)   Collection Time: 02/10/15 12:17 PM  Result Value Ref Range   Gestational Age(Wks) 6    ABO/RH(D) B NEG     Antibody Screen NEG    Unit Number [id number]    Blood Component Type RHIG    Unit division 00    Status of Unit ISSUED    Transfusion Status OK TO TRANSFUSE   hCG, quantitative, pregnancy     Status: Abnormal   Collection Time: 02/10/15 12:17 PM  Result Value Ref Range   hCG, Beta Chain, Quant, S 8261 (H) <5 mIU/mL    Comment:          GEST. AGE      CONC.  (mIU/mL)   <=1 WEEK        5 - 50     2 WEEKS       50 - 500     3 WEEKS       100 - 10,000     4 WEEKS     1,000 - 30,000     5 WEEKS     3,500 - 115,000   6-8 WEEKS     12,000 - 270,000    12 WEEKS     15,000 - 220,000        FEMALE AND NON-PREGNANT FEMALE:     LESS THAN  5 mIU/mL Performed at [facility]   CBC     Status: None   Collection Time: 02/10/15 12:18 PM  Result Value Ref Range   WBC 10.2 4.0 - 10.5 K/uL   RBC 4.61 3.87 - 5.11 MIL/uL   Hemoglobin 13.5 12.0 - 15.0 g/dL   HCT 45.4 09.8 - 11.9 %   MCV 84.6 78.0 - 100.0 fL   MCH 29.3 26.0 - 34.0 pg   MCHC 34.6 30.0 - 36.0 g/dL   RDW 14.7 82.9 - 56.2 %   Platelets 257 150 - 400 K/uL   US Ob Comp Less 14 Wks  02/10/2015   CLINICAL DATA:  Patient with heavy vaginal bleeding for 2 days.  EXAM: OBSTETRIC <14 WK [place] AND TRANSVAGINAL OB US  TECHNIQUE: Both transabdominal and transvaginal ultrasound examinations were performed for complete evaluation of the gestation as well as the maternal uterus, adnexal regions, and pelvic cul-de-sac. Transvaginal technique was performed to assess early pregnancy.  COMPARISON:  Ultrasound 03/19/2012  FINDINGS: Intrauterine gestational sac: Present  Yolk sac:  Present  Embryo:  Present  Cardiac Activity: Not definitely present.  CRL:  4.2  mm   6 w   1 d                  [place] EDC: 10/04/2015  Maternal uterus/adnexae: Normal right and left ovaries. Small subchorionic hemorrhage. Trace fluid in the pelvis.  IMPRESSION: Intrauterine gestation with crown-rump length of 4 mm. No definite heartbeat at this time. Findings are suspicious  but not yet definitive for failed pregnancy. Recommend follow-up US in 10-14 days for definitive diagnosis. This recommendation follows SRU consensus guidelines: Diagnostic Criteria for Nonviable Pregnancy Early in the First Trimester. Malva Limes Med 2013; 130:8657-84.   Electronically Signed   By: Annia Belt M.D.   On: 02/10/2015 13:50   US Ob Transvaginal  02/10/2015   CLINICAL DATA:  Patient with heavy vaginal bleeding for 2 days.  EXAM: OBSTETRIC <14 WK [place] AND TRANSVAGINAL OB US  TECHNIQUE: Both transabdominal and transvaginal ultrasound examinations were performed for complete evaluation of the gestation as well as the maternal uterus, adnexal regions, and pelvic cul-de-sac. Transvaginal technique was performed to assess early pregnancy.  COMPARISON:  Ultrasound 03/19/2012  FINDINGS: Intrauterine gestational sac: Present  Yolk sac:  Present  Embryo:  Present  Cardiac Activity: Not definitely present.  CRL:  4.2  mm   6 w   1 d                  [place] EDC: 10/04/2015  Maternal uterus/adnexae: Normal right and left ovaries. Small subchorionic hemorrhage. Trace fluid in the pelvis.  IMPRESSION: Intrauterine gestation with crown-rump length of 4 mm. No definite heartbeat at this time. Findings are suspicious but not yet definitive for failed pregnancy. Recommend follow-up US in 10-14 days for definitive diagnosis. This recommendation follows SRU consensus guidelines: Diagnostic Criteria for Nonviable Pregnancy Early in the First Trimester. Malva Limes Med 2013; 696:2952-84.   Electronically Signed   By: Annia Belt M.D.   On: 02/10/2015 13:50    Review of Systems  Constitutional: Negative for fever.  Gastrointestinal: Negative for nausea, vomiting and abdominal pain.   Physical Exam   Blood pressure 123/76, pulse 76, temperature 98.8 F (37.1 C), temperature source Oral, weight 63.504 kg (140 lb), last menstrual period 12/17/2014.  Physical Exam  Constitutional: She is oriented to person, place, and time.  She appears well-developed and well-nourished. No  distress.  HENT:  Head: Normocephalic.  Eyes: Pupils are equal, round, and reactive to light.  Neck: Neck supple.  Respiratory: Effort normal.  GI: Soft. She exhibits no distension. There is no tenderness. There is no rebound.  Genitourinary:  Speculum exam: Vagina - Small amount of dark red blood pooling in the vagina.  Cervix - + active bleeding  Bimanual exam: Cervix closed Uterus non tender, slightly enlarged  Adnexa non tender, no masses bilaterally Chaperone present for exam.  Musculoskeletal: Normal range of motion.  Neurological: She is alert and oriented to person, place, and time.  Skin: Skin is warm. She is not diaphoretic.  Psychiatric: Her behavior is normal.    MAU Course  Procedures  None  MDM  Reviewed [place] from the other clinic she went to which stated a probable fetal pole with a fetal heart rate of 86 bmp; CRL measuring around 5 weeks.  B negative blood type Patient received Rhogam today in MAU.   Assessment and Plan   A:  1. Threatened miscarriage   2. Vaginal bleeding in pregnancy, first trimester   3. Blood type, Rh negative   4. Subchorionic hematoma, first trimester    P:  Discharge home in stable condition Return to MAU in 48 hours for repeat Quant Bleeding precautions . Pelvic rest.  Return to MAU if symptoms worsen Support given    Duane Lope, NP 02/10/2015 12:10 PM

## 2015-02-10 NOTE — MAU Note (Signed)
Urine in lab 

## 2015-02-10 NOTE — Discharge Instructions (Signed)
Amenaza de aborto (Threatened Miscarriage) La amenaza de aborto se produce cuando hay hemorragia vaginal durante las primeras 20semanas de embarazo, pero el embarazo no se interrumpe. Si durante este perodo usted tiene hemorragia vaginal, el mdico le har pruebas para asegurarse de que el embarazo contine. Si las pruebas muestran que usted contina embarazada y que el "beb" en desarrollo (feto) dentro del tero sigue creciendo, se considera que tuvo una amenaza de aborto. La amenaza de aborto no implica que el embarazo vaya a terminar, pero s aumenta el riesgo de perder el embarazo (aborto completo). CAUSAS  Por lo general, no se conoce la causa de la amenaza de aborto. Si el resultado final es el aborto completo, la causa ms frecuente es la cantidad anormal de cromosomas del feto. Los cromosomas son las estructuras internas de las clulas que contienen todo el material gentico. Algunas de las causas de hemorragia vaginal que no ocasionan un aborto incluyen:  Las relaciones sexuales.  Las infecciones.  Los cambios hormonales normales durante el embarazo.  La hemorragia que se produce cuando el vulo se implanta en el tero. FACTORES DE RIESGO Los factores de riesgo de hemorragia al principio del embarazo incluyen:  Obesidad.  Fumar.  El consumo de cantidades excesivas de alcohol o cafena.  El consumo de drogas. SIGNOS Y SNTOMAS  Hemorragia vaginal leve.  Dolor o clicos abdominales leves. DIAGNSTICO  Si tiene hemorragia con o sin dolor abdominal antes de las 20semanas de embarazo, el mdico le har pruebas para determinar si el embarazo contina. Una prueba importante incluye el uso de ondas sonoras y de una computadora (ecografa) para crear imgenes del interior del tero. Otras pruebas incluyen el examen interno de la vagina y el tero (examen plvico), y el control de la frecuencia cardaca del feto.  Es posible que le diagnostiquen una amenaza de aborto en los  siguientes casos:  La ecografa muestra que el embarazo contina.  La frecuencia cardaca del feto es alta.  El examen plvico muestra que la apertura entre el tero y la vagina (cuello del tero) est cerrada.  Su frecuencia cardaca y su presin arterial estn estables.  Los anlisis de sangre confirman que el embarazo contina. TRATAMIENTO  No se ha demostrado que ningn tratamiento evite que una amenaza de aborto se convierta en un aborto completo. Sin embargo, los cuidados adecuados en el hogar son importantes.  INSTRUCCIONES PARA EL CUIDADO EN EL HOGAR   Asegrese de asistir a todas las citas de cuidados prenatales. Esto es muy importante.  Descanse lo suficiente.  No tenga relaciones sexuales ni use tampones si tiene hemorragia vaginal.  No se haga duchas vaginales.  No fume ni consuma drogas.  No beba alcohol.  Evite la cafena. SOLICITE ATENCIN MDICA SI:  Tiene una ligera hemorragia o manchado vaginal durante el embarazo.  Tiene dolor o clicos en el abdomen.  Tiene fiebre. SOLICITE ATENCIN MDICA DE INMEDIATO SI:  Tiene una hemorragia vaginal abundante.  Elimina cogulos de sangre por la vagina.  Siente dolor en la parte baja de la espalda o clicos abdominales intensos.  Tiene fiebre, escalofros y dolor abdominal intenso. ASEGRESE DE QUE:  Comprende estas instrucciones.  Controlar su afeccin.  Recibir ayuda de inmediato si no mejora o si empeora. Document Released: 02/14/2005 Document Revised: 05/12/2013 ExitCare Patient Information 2015 [employer]. This information is not intended to replace advice given to you by your health care provider. Make sure you discuss any questions you have with your health care provider.    Hemorragia vaginal durante el embarazo (primer trimestre) (Vaginal Bleeding During Pregnancy, First Trimester) Durante los primeros meses del embarazo es relativamente frecuente que se presente una pequea hemorragia  (manchas). Esta situacin generalmente mejora por s misma. Estas hemorragias o manchas tienen diversas causas al inicio del embarazo. Algunas manchas pueden estar relacionadas al [employer] y otras no. En la [employer], la hemorragia es normal y no es un problema. Sin embargo, la hemorragia tambin puede ser un signo de algo grave. Debe informar a su mdico de inmediato si tiene alguna hemorragia vaginal. Algunas causas posibles de hemorragia vaginal durante el primer trimestre incluyen:  Infeccin o inflamacin del cuello del tero.  Crecimientos anormales (plipos) en el cuello del tero.  Aborto espontneo o amenaza de aborto espontneo.  Tejido del Psychiatrist se ha desarrollado fuera del tero y en una trompa de falopio (embarazo ectpico).  Se han desarrollado pequeos quistes en el tero en lugar de tejido de embarazo (embarazo molar). INSTRUCCIONES PARA EL CUIDADO EN EL HOGAR  Controle su afeccin para ver si hay cambios. Las siguientes indicaciones ayudarn a Psychologist, educational [employer] pueda sentir:  Siga las indicaciones del mdico para restringir su actividad. Si el mdico le indica descanso en la cama, debe quedarse en la cama y levantarse solo para ir al bao. No obstante, el mdico puede permitirle que continu con tareas livianas.  Si es necesario, organcese para que alguien le ayude con las actividades y responsabilidades cotidianas mientras est en cama.  Lleve un registro de la cantidad y la saturacin de las toallas higinicas que Landscape architect. Anote este dato.  No use tampones.No se haga duchas vaginales.  No tenga relaciones sexuales u orgasmos hasta que el mdico la autorice.  Si elimina tejido por la vagina, gurdelo para mostrrselo al [employer].  Ogema solo medicamentos de venta libre o recetados, segn las indicaciones del mdico.  No tome aspirina, ya que puede causar hemorragias.  Cumpla con todas las visitas de control, segn le indique su  mdico. SOLICITE ATENCIN MDICA SI:  Tiene un sangrado vaginal en cualquier momento del embarazo.  Tiene calambres o dolores de [place].  Tiene fiebre que los medicamentos no Sports coach. SOLICITE ATENCIN MDICA DE INMEDIATO SI:   Siente calambres intensos en la espalda o en el vientre (abdomen).  Elimina cogulos grandes o tejido por la vagina.  La hemorragia aumenta.  Si se siente mareada, dbil o se desmaya.  Tiene escalofros.  Tiene una prdida importante o sale lquido a borbotones por la vagina.  Se desmaya mientras defeca. ASEGRESE DE QUE:  Comprende estas instrucciones.  Controlar su afeccin.  Recibir ayuda de inmediato si no mejora o si empeora. Document Released: 02/14/2005 Document Revised: 05/12/2013 [facility] Patient Information 2015 [place], [place]. This information is not intended to replace advice given to you by your health care provider. Make sure you discuss any questions you have with your health care provider.

## 2015-02-10 NOTE — MAU Note (Signed)
Pt stated she has been spotting since last week. Bleeding has gotten heavier in the last 2 days. Was seen at t [facility] on Tues. U/S said 5 week 6 day IUP with HR of 86. Was told to f/u with a OBGYN. Pt came here because the bleeding got heavier. Pt denies pain at this time.

## 2015-02-11 LAB — HIV ANTIBODY (ROUTINE TESTING W REFLEX): HIV SCREEN 4TH GENERATION: NONREACTIVE

## 2015-02-11 LAB — RH IG WORKUP (INCLUDES ABO/RH)
ABO/RH(D): B NEG
ANTIBODY SCREEN: NEGATIVE
Gestational Age(Wks): 6
Unit division: 0

## 2015-02-12 ENCOUNTER — Inpatient Hospital Stay (HOSPITAL_COMMUNITY)
Admission: AD | Admit: 2015-02-12 | Discharge: 2015-02-12 | Disposition: A | Discharge status: Home / Self Care | Payer: Self-pay | Source: Ambulatory Visit | Attending: Obstetrics and Gynecology | Admitting: Obstetrics and Gynecology

## 2015-02-12 DIAGNOSIS — Z3A01 Less than 8 weeks gestation of pregnancy: Secondary | ICD-10-CM | POA: Insufficient documentation

## 2015-02-12 DIAGNOSIS — O360111 Maternal care for anti-D [Rh] antibodies, first trimester, fetus 1: Secondary | ICD-10-CM

## 2015-02-12 DIAGNOSIS — O039 Complete or unspecified spontaneous abortion without complication: Secondary | ICD-10-CM | POA: Insufficient documentation

## 2015-02-12 DIAGNOSIS — Z6791 Unspecified blood type, Rh negative: Secondary | ICD-10-CM | POA: Insufficient documentation

## 2015-02-12 LAB — HCG, QUANTITATIVE, PREGNANCY: hCG, Beta Chain, Quant, S: 942 m[IU]/mL — ABNORMAL HIGH (ref <5)

## 2015-02-12 NOTE — MAU Note (Signed)
Pt presents to MAU for repeat BHCG. Reports small amount of vaginal bleeding, denies any pain

## 2015-02-12 NOTE — MAU Provider Note (Signed)
S:  Maria [name] is a 34 y.o. female [phone] at [redacted]w[redacted]d presenting for a follow quant. She was seen two days ago with vaginal bleeding in pregnancy.   She continues to have light bleeding, no pain.    O:  GENERAL: Well-developed, well-nourished female in no acute distress.  LUNGS: Effort normal SKIN: Warm, dry and without erythema PSYCH: Normal mood and affect  Filed Vitals:   02/12/15 0938  BP: 137/73  Pulse: 78  Resp: 18   Results for orders placed or performed during the hospital encounter of 02/12/15 (from the past 24 hour(s))  hCG, quantitative, pregnancy     Status: Abnormal   Collection Time: 02/12/15  8:48 AM  Result Value Ref Range   hCG, Beta Chain, Quant, S 942 (H) <5 mIU/mL    MDM  Beta hcg level 9/22: 8261 Beta hcg level 9/24: 942  Patient received rhogam On 9/22   A:  1. SAB (spontaneous abortion)   2. Rh negative state in antepartum period, first trimester, fetus 1     P:  Discharge home in stable condition Return to MAU if symptoms worsen Follow up in the [facility] in 1 week for repeat beta hcg Pelvic rest  Support given     Duane Lope, NP 02/12/2015 10:13 AM

## 2015-02-12 NOTE — Discharge Instructions (Signed)
Aborto espontáneo  °(Miscarriage) °El aborto espontáneo es la pérdida de un bebé que no ha nacido (feto) antes de la semana 20 del embarazo. La mayor parte de estos abortos ocurre en los primeros 3 meses. En algunos casos ocurre antes de que la mujer sepa que está embarazada. También se denomina "aborto espontáneo" o "pérdida prematura del embarazo". El aborto espontáneo puede ser una experiencia que afecte emocionalmente a la persona. Converse con su médico si tiene dudas, cómo es el proceso de duelo, y sobre planes futuros de embarazo.  °CAUSAS  °· Algunos problemas cromosómicos pueden hacer imposible que el bebé se desarrolle normalmente. Los problemas con los genes o cromosomas del bebé son generalmente el resultado de errores que se producen, por casualidad, cuando el embrión se divide y crece. Estos problemas no se heredan de los padres. °· Infección en el cuello del útero.   °· Problemas hormonales.   °· Problemas en el cuello del útero, como tener un útero incompetente. Esto ocurre cuando los tejidos no son lo suficientemente fuertes como para contener el embarazo.   °· Problemas del útero, como un útero con forma anormal, los fibromas o anormalidades congénitas.   °· Ciertas enfermedades crónicas.   °· No fume, no beba alcohol, ni consuma drogas.   °· Traumatismos   °A veces, la causa es desconocida.  °SÍNTOMAS  °· Sangrado o manchado vaginal, con o sin cólicos o dolor. °· Dolor o cólicos en el abdomen o en la cintura. °· Eliminación de líquido, tejidos o coágulos grandes por la vagina. °DIAGNÓSTICO  °El médico le hará un examen físico. También le indicará una ecografía para confirmar el aborto. Es posible que se realicen análisis de sangre.  °TRATAMIENTO  °· En algunos casos el tratamiento no es necesario, si se eliminan naturalmente todos los tejidos embrionarios que se encontraban en el útero. Si el feto o la placenta quedan dentro del útero (aborto incompleto), pueden infectarse, los tejidos que quedan  pueden infectarse y deben retirarse. Generalmente se realiza un procedimiento de dilatación y curetaje (D y C). Durante el procedimiento de dilatación y curetaje, el cuello del útero se abre (dilata) y se retira cualquier resto de tejido fetal o placentario del útero. °· Si hay una infección, le recetarán antibióticos. Podrán recetarle otros medicamentos para reducir el tamaño del útero (contraerlo) si hay una mucho sangrado. °· Si su sangre es Rh negativa y su bebé es Rh positivo, usted necesitará la inyección de inmunoglobulina Rh. Esta inyección protegerá a los futuros bebés de tener problemas de compatibilidad Rh en futuros embarazos. °INSTRUCCIONES PARA EL CUIDADO EN EL HOGAR  °· El médico le indicará reposo en cama o le permitirá realizar actividades livianas. Vuelva a la actividad lentamente o según las indicaciones de su médico. °· Pídale a alguien que la ayude con las responsabilidades familiares y del hogar durante este tiempo.   °· Lleve un registro de la cantidad y la saturación de las toallas higiénicas que utiliza cada día. Anote esta información   °· No use tampones. No No se haga duchas vaginales ni tenga relaciones sexuales hasta que el médico la autorice.   °· Sólo tome medicamentos de venta libre o recetados para calmar el dolor o el malestar, según las indicaciones de su médico.   °· No tome aspirina. La aspirina puede ocasionar hemorragias.   °· Concurra puntualmente a las citas de control con el médico.   °· Si usted o su pareja tienen dificultades con el duelo, hable con su médico para buscar la ayuda psicológica que los ayude a enfrentar la pérdida   del embarazo. Permítase el tiempo suficiente de duelo antes de quedar embarazada nuevamente.   °SOLICITE ATENCIÓN MÉDICA DE INMEDIATO SI:  °· Siente calambres intensos o dolor en la espalda o en el abdomen. °· Tiene fiebre. °· Elimina grandes coágulos de sangre (del tamaño de una nuez o más) o tejidos por la vagina. Guarde lo que ha eliminado para  que su médico lo examine.   °· La hemorragia aumenta.   °· Observa una secreción vaginal espesa y con mal olor. °· Se siente mareada, débil, o se desmaya.   °· Siente escalofríos.   °ASEGÚRESE DE QUE:  °· Comprende estas instrucciones. °· Controlará su enfermedad. °· Solicitará ayuda de inmediato si no mejora o si empeora. °Document Released: 02/14/2005 Document Revised: 09/01/2012 °ExitCare® Patient Information ©2015 ExitCare, LLC. This information is not intended to replace advice given to you by your health care provider. Make sure you discuss any questions you have with your health care provider. ° °

## 2015-02-23 ENCOUNTER — Encounter: Payer: Self-pay | Admitting: Obstetrics and Gynecology

## 2015-02-23 ENCOUNTER — Ambulatory Visit (INDEPENDENT_AMBULATORY_CARE_PROVIDER_SITE_OTHER): Payer: Self-pay | Admitting: Obstetrics and Gynecology

## 2015-02-23 VITALS — BP 125/61 | HR 67 | Temp 98.5°F | Ht 65.0 in | Wt 141.8 lb

## 2015-02-23 DIAGNOSIS — O039 Complete or unspecified spontaneous abortion without complication: Secondary | ICD-10-CM

## 2015-02-23 NOTE — Patient Instructions (Signed)
Aborto espontáneo  °(Miscarriage) ° El aborto espontáneo es la pérdida de un bebé que no ha nacido.(feto) antes de la semana 20 del embarazo. La causa generalmente es desconocida.  °CUIDADOS EN EL HOGAR  °· Debe permanecer en cama (reposo en cama) o podrá hacer actividades livianas. Regrese a sus actividades según las indicaciones del médico. °· Pida ayuda con las tareas domésticas. °· Anote cuántos apósitos usa por día. Describa el grado en que están empapados. °· No use tampones. No se higienice la vagina (duchas vaginales) ni tenga relaciones sexuales (coito) hasta que el médico la autorice. °· Sólo debe tomar la medicación según las indicaciones del médico. °· No tome aspirina. °· Cumpla con los controles médicos según las indicaciones. °· Si usted o su pareja tienen problemas con el duelo, hable con su médico. También puede intentar con psicoterapia. Permítase el tiempo suficiente de duelo antes de quedar embarazada nuevamente. °SOLICITE AYUDA DE INMEDIATO SI:  °· Siente cólicos intensos o dolor en el estómago, en la espalda o en el vientre (abdomen). °· Tiene fiebre. °· Elimina grumos de sangre (coágulos) por la vagina, que tienen el tamaño de una nuez o más. Guarde los coágulos para que el médico los vea. °· Elimina gran cantidad de tejidos por la vagina. Guarde lo que ha eliminado para que su médico lo examine. °· Aumenta el sangrado. °· Observa una secreción espesa, con mal olor (pérdida) que proviene de la vagina. °· Se siente mareada, débil o se desvanece (se desmaya). °· Siente escalofríos. °ASEGÚRESE DE QUE:  °· Comprende estas instrucciones. °· Controlará su enfermedad. °· Solicitará ayuda de inmediato si no mejora o si empeora. °  °Esta información no tiene como fin reemplazar el consejo del médico. Asegúrese de hacerle al médico cualquier pregunta que tenga. °  °Document Released: 11/06/2011 °Elsevier Interactive Patient Education ©2016 Elsevier Inc. ° °

## 2015-02-23 NOTE — Progress Notes (Signed)
Spanish Interpreter [employer]

## 2015-02-23 NOTE — Progress Notes (Signed)
CLINIC ENCOUNTER NOTE  History:  34 y.o. [id number] here today for f/u AB.  Had fetal cardiac activity seen at about 5 weeks on outside u/s.  On 9/22 presented to our mau with 2 days vaginal bleeding. U/s showed no fetal cardiac activity, HCG 8261. 2 days later hct 942, confirming spontaneous abortion.  Stopped bleeding 10/1. No cramping. No pain. No nausea/vomiting/fever. Passed what may have been products, with significant cramping, on 9/22.  Past Medical History  Diagnosis Date  . Rh incompatibility   . Mucocele of salivary gland   . Gestational diabetes mellitus in pregnancy, diet-controlled   . Blood type, Rh negative   . Abnormal Pap smear     Past Surgical History  Procedure Laterality Date  . Cesarean section  05/27/07    CPD    The following portions of the patient's history were reviewed and updated as appropriate: allergies, current medications, past family history, past medical history, past social history, past surgical history and problem list.   Health Maintenance:  Normal pap and negative HRHPV on 09/2013  Review of Systems:  See above; comprehensive review of systems was otherwise negative.  Objective:  Physical Exam BP 125/61 mmHg  Pulse 67  Temp(Src) 98.5 F (36.9 C) (Oral)  Ht  (1.651 m)  Wt 141 lb 12.8 oz (64.32 kg)  BMI 23.60 kg/m2  LMP 12/17/2014 CONSTITUTIONAL: Well-developed, well-nourished female in no acute distress.  HENT:  Normocephalic, atraumatic SKIN: Skin is warm and dry.  NEUROLGIC: Alert  PSYCHIATRIC: Normal mood and affect.  CARDIOVASCULAR: Normal heart rate noted RESPIRATORY: Effort and breath sounds normal, no problems with respiration noted ABDOMEN: Soft, no distention noted.  No tenderness, rebound or guarding.    Labs and Imaging US Ob Comp Less 14 Wks  02/10/2015   CLINICAL DATA:  Patient with heavy vaginal bleeding for 2 days.  EXAM: OBSTETRIC <14 WK [place] AND TRANSVAGINAL OB US  TECHNIQUE: Both transabdominal and  transvaginal ultrasound examinations were performed for complete evaluation of the gestation as well as the maternal uterus, adnexal regions, and pelvic cul-de-sac. Transvaginal technique was performed to assess early pregnancy.  COMPARISON:  Ultrasound 03/19/2012  FINDINGS: Intrauterine gestational sac: Present  Yolk sac:  Present  Embryo:  Present  Cardiac Activity: Not definitely present.  CRL:  4.2  mm   6 w   1 d                  [place] EDC: 10/04/2015  Maternal uterus/adnexae: Normal right and left ovaries. Small subchorionic hemorrhage. Trace fluid in the pelvis.  IMPRESSION: Intrauterine gestation with crown-rump length of 4 mm. No definite heartbeat at this time. Findings are suspicious but not yet definitive for failed pregnancy. Recommend follow-up US in 10-14 days for definitive diagnosis. This recommendation follows SRU consensus guidelines: Diagnostic Criteria for Nonviable Pregnancy Early in the First Trimester. Malva Limes Med 2013; 454:0981-19.   Electronically Signed   By: Annia Belt M.D.   On: 02/10/2015 13:50   US Ob Transvaginal  02/10/2015   CLINICAL DATA:  Patient with heavy vaginal bleeding for 2 days.  EXAM: OBSTETRIC <14 WK [place] AND TRANSVAGINAL OB US  TECHNIQUE: Both transabdominal and transvaginal ultrasound examinations were performed for complete evaluation of the gestation as well as the maternal uterus, adnexal regions, and pelvic cul-de-sac. Transvaginal technique was performed to assess early pregnancy.  COMPARISON:  Ultrasound 03/19/2012  FINDINGS: Intrauterine gestational sac: Present  Yolk sac:  Present  Embryo:  Present  Cardiac Activity: Not definitely present.  CRL:  4.2  mm   6 w   1 d                  [place] EDC: 10/04/2015  Maternal uterus/adnexae: Normal right and left ovaries. Small subchorionic hemorrhage. Trace fluid in the pelvis.  IMPRESSION: Intrauterine gestation with crown-rump length of 4 mm. No definite heartbeat at this time. Findings are suspicious but not yet  definitive for failed pregnancy. Recommend follow-up US in 10-14 days for definitive diagnosis. This recommendation follows SRU consensus guidelines: Diagnostic Criteria for Nonviable Pregnancy Early in the First Trimester. Malva Limes Med 2013; 161:0960-45.   Electronically Signed   By: Annia Belt M.D.   On: 02/10/2015 13:50    Assessment & Plan:   # Spontaneous abortion - likely complete - repeat hcg today - counseled doesn't need w/u as SABs are not recurrent. Last pregnancy resulted in term vaginal delivery - received rhogam - wants condoms for contraception; wants to get pregnant again - continue PNV  Routine preventative health maintenance measures emphasized.     Kaytlan Behrman B. Ronit Cranfield, MD OB/GYN Fellow [facility] for [employer], [facility]

## 2015-02-24 ENCOUNTER — Ambulatory Visit: Payer: Self-pay | Admitting: Internal Medicine

## 2015-02-24 LAB — HCG, QUANTITATIVE, PREGNANCY: HCG, BETA CHAIN, QUANT, S: 6.7 m[IU]/mL

## 2015-02-25 ENCOUNTER — Telehealth: Payer: Self-pay | Admitting: General Practice

## 2015-02-25 ENCOUNTER — Telehealth: Payer: Self-pay

## 2015-02-25 NOTE — Telephone Encounter (Signed)
Per Dr Ashok Pall, patient needs no further follow up of bhcg. Patient needs no additional follow up unless periods do not resume in 6 weeks or unless she has significant pain/bleeding. Called patient with pacific interpreter [phone] and informed her of results and recommendations. Patient verbalized understanding and had no questions

## 2015-02-25 NOTE — Telephone Encounter (Signed)
Called pt but the translator had stepped out for the day pt still needs to be informed of hcg levels.

## 2015-02-25 NOTE — Telephone Encounter (Signed)
-----   Message from [facility], MD sent at 02/24/2015 12:25 PM EDT ----- Please call patient and explain that her hcg is essentially zero, so no more labs we need to follow. So long as her period resumes within the next 6 weeks and she does not have any more significnat bleeding or cramping, she doesn't need any more follow-up for her miscarriage. Thanks, Anette Riedel  ----- Message -----    From: Lab in [facility] Interface    Sent: 02/24/2015   5:41 AM      To: Kathrynn Running, MD

## 2015-06-17 LAB — POCT GLUCOSE (2 HR PP): Glucose 2 Hr PP, POC: 118 mg/dL

## 2015-12-08 ENCOUNTER — Encounter (HOSPITAL_COMMUNITY): Payer: Self-pay | Admitting: *Deleted

## 2015-12-08 ENCOUNTER — Inpatient Hospital Stay (HOSPITAL_COMMUNITY): Payer: Self-pay

## 2015-12-08 ENCOUNTER — Inpatient Hospital Stay (HOSPITAL_COMMUNITY)
Admission: AD | Admit: 2015-12-08 | Discharge: 2015-12-08 | Disposition: A | Discharge status: Home / Self Care | Payer: Self-pay | Source: Ambulatory Visit | Attending: Family Medicine | Admitting: Family Medicine

## 2015-12-08 DIAGNOSIS — O021 Missed abortion: Secondary | ICD-10-CM | POA: Insufficient documentation

## 2015-12-08 DIAGNOSIS — Z3A08 8 weeks gestation of pregnancy: Secondary | ICD-10-CM | POA: Insufficient documentation

## 2015-12-08 DIAGNOSIS — O209 Hemorrhage in early pregnancy, unspecified: Secondary | ICD-10-CM

## 2015-12-08 LAB — WET PREP, GENITAL
CLUE CELLS WET PREP: NONE SEEN
Sperm: NONE SEEN
Trich, Wet Prep: NONE SEEN
Yeast Wet Prep HPF POC: NONE SEEN

## 2015-12-08 LAB — HCG, QUANTITATIVE, PREGNANCY: hCG, Beta Chain, Quant, S: 45798 m[IU]/mL — ABNORMAL HIGH (ref <5)

## 2015-12-08 LAB — URINALYSIS, ROUTINE W REFLEX MICROSCOPIC
BILIRUBIN URINE: NEGATIVE
Glucose, UA: NEGATIVE mg/dL
Ketones, ur: NEGATIVE mg/dL
Leukocytes, UA: NEGATIVE
Nitrite: NEGATIVE
PH: 5.5 (ref 5.0–8.0)
Protein, ur: NEGATIVE mg/dL

## 2015-12-08 LAB — CBC WITH DIFFERENTIAL/PLATELET
BASOS ABS: 0 10*3/uL (ref 0.0–0.1)
Basophils Relative: 0 %
Eosinophils Absolute: 0.1 10*3/uL (ref 0.0–0.7)
Eosinophils Relative: 1 %
HEMATOCRIT: 36.5 % (ref 36.0–46.0)
Hemoglobin: 12.7 g/dL (ref 12.0–15.0)
LYMPHS PCT: 30 %
Lymphs Abs: 2.3 10*3/uL (ref 0.7–4.0)
MCH: 28.5 pg (ref 26.0–34.0)
MCHC: 34.8 g/dL (ref 30.0–36.0)
MCV: 81.8 fL (ref 78.0–100.0)
MONO ABS: 0.2 10*3/uL (ref 0.1–1.0)
MONOS PCT: 3 %
NEUTROS ABS: 5.1 10*3/uL (ref 1.7–7.7)
Neutrophils Relative %: 66 %
Platelets: 280 10*3/uL (ref 150–400)
RBC: 4.46 MIL/uL (ref 3.87–5.11)
RDW: 12.6 % (ref 11.5–15.5)
WBC: 7.6 10*3/uL (ref 4.0–10.5)

## 2015-12-08 LAB — URINE MICROSCOPIC-ADD ON: BACTERIA UA: NONE SEEN

## 2015-12-08 LAB — POCT PREGNANCY, URINE: Preg Test, Ur: POSITIVE — AB

## 2015-12-08 MED ORDER — MISOPROSTOL 200 MCG PO TABS: 800.0000 ug | ORAL_TABLET | Freq: Once | ORAL | Status: DC

## 2015-12-08 MED ORDER — RHO D IMMUNE GLOBULIN 1500 UNIT/2ML IJ SOSY
300.0000 ug | PREFILLED_SYRINGE | Freq: Once | INTRAMUSCULAR | Status: AC
  Administered 2015-12-08: 300 ug via INTRAMUSCULAR
  Filled 2015-12-08: qty 2

## 2015-12-08 MED ORDER — OXYCODONE-ACETAMINOPHEN 5-325 MG PO TABS: 2.0000 | ORAL_TABLET | ORAL | Status: DC | PRN

## 2015-12-08 NOTE — MAU Note (Signed)
Pt states she had a positive home pregnancy test.  Pt states that she has a tiny amount of bleeding when she goes to the bathroom.

## 2015-12-08 NOTE — Discharge Instructions (Signed)

## 2015-12-08 NOTE — MAU Provider Note (Signed)
History     CSN: [id number]  Arrival date and time: 12/08/15 1514   None     Chief Complaint  Patient presents with  . Vaginal Bleeding   HPI Pt is 34 y.W.[id number] at [redacted]w[redacted]d GA.  Pt presents with spotting in pregnancy.  Pt noticed spotting when she went to the bathroom- no cramping or pain or UTI sx. Pt has hx SAB 3 months ago with sx beginning as spotting. [facility] Pt is Rh neg Rn note:      Expand All Collapse All   Pt states she had a positive home pregnancy test. Pt states that she has a tiny amount of bleeding when she goes to the bathroom.       Past Medical History  Diagnosis Date  . Rh incompatibility   . Mucocele of salivary gland   . Gestational diabetes mellitus in pregnancy, diet-controlled   . Blood type, Rh negative   . Abnormal Pap smear     Past Surgical History  Procedure Laterality Date  . Cesarean section  05/27/07    CPD    Family History  Problem Relation Age of Onset  . Diabetes Mother   . Diabetes Father   . Diabetes Maternal Grandmother   . Hearing loss Sister     Social History  Substance Use Topics  . Smoking status: Never Smoker   . Smokeless tobacco: Never Used  . Alcohol Use: No    Allergies: No Known Allergies  Prescriptions prior to admission  Medication Sig Dispense Refill Last Dose  . Prenatal Vit-Fe Fumarate-FA (MULTIVITAMIN-PRENATAL) 27-0.8 MG TABS tablet Take 1 tablet by mouth daily at 12 noon.   Not Taking    Review of Systems  Gastrointestinal: Negative for nausea, vomiting, abdominal pain, diarrhea and constipation.  Genitourinary: Negative for dysuria.  Neurological: Positive for dizziness. Negative for headaches.   Physical Exam   Blood pressure 112/65, pulse 80, temperature 98.3 F (36.8 C), temperature source Oral, resp. rate 16, last menstrual period 10/10/2015, unknown if currently breastfeeding.  Physical Exam  Nursing note and vitals reviewed. Constitutional: She is  oriented to person, place, and time. She appears well-developed and well-nourished. No distress.  HENT:  Head: Normocephalic.  Eyes: Pupils are equal, round, and reactive to light.  Neck: Normal range of motion. Neck supple.  Cardiovascular: Normal rate.   Respiratory: Effort normal.  GI: Soft. She exhibits no distension. There is no tenderness. There is no rebound and no guarding.  Genitourinary:  Small amount of thin white discharge in vault; cervix clean, closed, NT; uterus retroverted unable to outline size- NT; adnexa without palpable enlargement or tenderness  Musculoskeletal: Normal range of motion.  Neurological: She is alert and oriented to person, place, and time.  Skin: Skin is warm and dry.  Psychiatric: She has a normal mood and affect.    MAU Course  Procedures Results for orders placed or performed during the hospital encounter of 12/08/15 (from the past 24 hour(s))  Urinalysis, Routine w reflex microscopic (not at [facility])     Status: Abnormal   Collection Time: 12/08/15  3:40 PM  Result Value Ref Range   Color, Urine STRAW (A) YELLOW   APPearance CLEAR CLEAR   Specific Gravity, Urine <1.005 (L) 1.005 - 1.030   pH 5.5 5.0 - 8.0   Glucose, UA NEGATIVE NEGATIVE mg/dL   Hgb urine dipstick TRACE (A) NEGATIVE   Bilirubin Urine NEGATIVE NEGATIVE   Ketones, ur NEGATIVE NEGATIVE  mg/dL   Protein, ur NEGATIVE NEGATIVE mg/dL   Nitrite NEGATIVE NEGATIVE   Leukocytes, UA NEGATIVE NEGATIVE  Urine microscopic-add on     Status: Abnormal   Collection Time: 12/08/15  3:40 PM  Result Value Ref Range   Squamous Epithelial / LPF 0-5 (A) NONE SEEN   WBC, UA 0-5 0 - 5 WBC/hpf   RBC / HPF 0-5 0 - 5 RBC/hpf   Bacteria, UA NONE SEEN NONE SEEN  Pregnancy, urine POC     Status: Abnormal   Collection Time: 12/08/15  4:03 PM  Result Value Ref Range   Preg Test, Ur POSITIVE (A) NEGATIVE  Wet prep, genital     Status: Abnormal   Collection Time: 12/08/15  4:10 PM  Result Value Ref Range    Yeast Wet Prep HPF POC NONE SEEN NONE SEEN   Trich, Wet Prep NONE SEEN NONE SEEN   Clue Cells Wet Prep HPF POC NONE SEEN NONE SEEN   WBC, Wet Prep HPF POC FEW (A) NONE SEEN   Sperm NONE SEEN   CBC with Differential     Status: None   Collection Time: 12/08/15  4:22 PM  Result Value Ref Range   WBC 7.6 4.0 - 10.5 K/uL   RBC 4.46 3.87 - 5.11 MIL/uL   Hemoglobin 12.7 12.0 - 15.0 g/dL   HCT 16.136.5 09.636.0 - 04.546.0 %   MCV 81.8 78.0 - 100.0 fL   MCH 28.5 26.0 - 34.0 pg   MCHC 34.8 30.0 - 36.0 g/dL   RDW 40.912.6 81.111.5 - 91.415.5 %   Platelets 280 150 - 400 K/uL   Neutrophils Relative % 66 %   Neutro Abs 5.1 1.7 - 7.7 K/uL   Lymphocytes Relative 30 %   Lymphs Abs 2.3 0.7 - 4.0 K/uL   Monocytes Relative 3 %   Monocytes Absolute 0.2 0.1 - 1.0 K/uL   Eosinophils Relative 1 %   Eosinophils Absolute 0.1 0.0 - 0.7 K/uL   Basophils Relative 0 %   Basophils Absolute 0.0 0.0 - 0.1 K/uL  Rh IG workup (includes ABO/Rh)     Status: None (Preliminary result)   Collection Time: 12/08/15  4:22 PM  Result Value Ref Range   Gestational Age(Wks) 8.3    ABO/RH(D) B NEG    Antibody Screen PENDING   Rhogam ordered GC/Chlamydia pending [place] Ob Comp Less 14 Wks  12/08/2015  CLINICAL DATA:  Early pregnancy, vaginal bleeding EXAM: OBSTETRIC <14 WK US AND TRANSVAGINAL OB US TECHNIQUE: Both transabdominal and transvaginal ultrasound examinations were performed for complete evaluation of the gestation as well as the maternal uterus, adnexal regions, and pelvic cul-de-sac. Transvaginal technique was performed to assess early pregnancy. COMPARISON:  02/10/2015 FINDINGS: Intrauterine gestational sac: Single Yolk sac:  Visualized Embryo:  Visualized Cardiac Activity: Not visualized Heart Rate: Not detected CRL:  5  mm   6 w   2 d                  US EDC: 07/31/2016 Subchorionic hemorrhage:  None visualized. Maternal uterus/adnexae: Ovaries appear normal.  No free fluid. IMPRESSION: Single intrauterine pregnancy with a mean  crown-rump length of 5 mm. No visualized cardiac activity or detectable heartbeat. Findings are suspicious but not yet definitive for failed pregnancy. Recommend follow-up US in 10-14 days for definitive diagnosis. This recommendation follows SRU consensus guidelines: Diagnostic Criteria for Nonviable Pregnancy Early in the First Trimester. Malva Limes Engl J Med 2013; 782:9562-13; 369:1443-51. Electronically Signed   By: Judie PetitM.  Shick M.D.   On: 12/08/2015 17:14   US Ob Transvaginal  12/08/2015  CLINICAL DATA:  Early pregnancy, vaginal bleeding EXAM: OBSTETRIC <14 WK [place] AND TRANSVAGINAL OB US TECHNIQUE: Both transabdominal and transvaginal ultrasound examinations were performed for complete evaluation of the gestation as well as the maternal uterus, adnexal regions, and pelvic cul-de-sac. Transvaginal technique was performed to assess early pregnancy. COMPARISON:  02/10/2015 FINDINGS: Intrauterine gestational sac: Single Yolk sac:  Visualized Embryo:  Visualized Cardiac Activity: Not visualized Heart Rate: Not detected CRL:  5  mm   6 w   2 d                  [place] EDC: 07/31/2016 Subchorionic hemorrhage:  None visualized. Maternal uterus/adnexae: Ovaries appear normal.  No free fluid. IMPRESSION: Single intrauterine pregnancy with a mean crown-rump length of 5 mm. No visualized cardiac activity or detectable heartbeat. Findings are suspicious but not yet definitive for failed pregnancy. Recommend follow-up US in 10-14 days for definitive diagnosis. This recommendation follows SRU consensus guidelines: Diagnostic Criteria for Nonviable Pregnancy Early in the First Trimester. Malva Limes Med 2013; 956:2130-86. Electronically Signed   By: Judie Petit.  Shick M.D.   On: 12/08/2015 17:14   Discussed with pt about missed ab Pt very upset since this is 2nd missed ab - last one 3 mos ago Discussed options of expectant management vs cytotec Pt is B neg - Rhophylac given Will have pt f/u with clinic in 2 weeks- note sent to clinic  Assessment and Plan   Missed ab [redacted]w[redacted]d -Rx Cytotec in vagina at one time Rx Percocet for pain Rh neg- Rhophylac given F/u in 2 weeks at clinic Return sooner if heavy bleeding or pain  Nicoletta Hush 12/08/2015, 4:01 PM

## 2015-12-09 LAB — RH IG WORKUP (INCLUDES ABO/RH)
ABO/RH(D): B NEG
Antibody Screen: NEGATIVE
Gestational Age(Wks): 8.3
Unit division: 0

## 2015-12-09 LAB — GC/CHLAMYDIA PROBE AMP (~~LOC~~) NOT AT ARMC
Chlamydia: NEGATIVE
Neisseria Gonorrhea: NEGATIVE

## 2015-12-09 LAB — HIV ANTIBODY (ROUTINE TESTING W REFLEX): HIV Screen 4th Generation wRfx: NONREACTIVE

## 2016-01-03 ENCOUNTER — Encounter: Payer: Self-pay | Admitting: Medical

## 2016-01-03 ENCOUNTER — Ambulatory Visit (INDEPENDENT_AMBULATORY_CARE_PROVIDER_SITE_OTHER): Payer: Self-pay | Admitting: Medical

## 2016-01-03 VITALS — BP 119/70 | HR 68 | Wt 146.0 lb

## 2016-01-03 DIAGNOSIS — N96 Recurrent pregnancy loss: Secondary | ICD-10-CM

## 2016-01-03 DIAGNOSIS — O039 Complete or unspecified spontaneous abortion without complication: Secondary | ICD-10-CM

## 2016-01-03 LAB — CBC
HEMATOCRIT: 38.5 % (ref 35.0–45.0)
HEMOGLOBIN: 12.8 g/dL (ref 11.7–15.5)
MCH: 28.3 pg (ref 27.0–33.0)
MCHC: 33.2 g/dL (ref 32.0–36.0)
MCV: 85.2 fL (ref 80.0–100.0)
MPV: 10.3 fL (ref 7.5–12.5)
Platelets: 274 10*3/uL (ref 140–400)
RBC: 4.52 MIL/uL (ref 3.80–5.10)
RDW: 13.6 % (ref 11.0–15.0)
WBC: 6.1 10*3/uL (ref 3.8–10.8)

## 2016-01-03 LAB — TSH: TSH: 1.64 m[IU]/L

## 2016-01-03 NOTE — Progress Notes (Signed)
History:  [name] is a 35 y.o. [id number] who presents to clinic today for follow-up after SAB at [redacted]wks GA.  The patient was diagnosed in MAU with missed AB on 12/08/15. She was given Cytotec which she placed vaginally and had ~ 15 days of heavy bleeding. She denies bleeding or pain today. She has not developed fever. 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 She does wish to have future pregnancies.   The following portions of the patient's history were reviewed and updated as appropriate: allergies, current medications, family history, past medical history, social history, past surgical history and problem list.  Review of Systems:  Review of Systems  Constitutional: Negative for fever.  Gastrointestinal: Negative for abdominal pain.  Genitourinary:       Neg - vaginal bleeding     Objective:  Physical Exam BP 119/70   Pulse 68   Wt 146 lb (66.2 kg)   LMP 10/10/2015   Breastfeeding? Unknown   BMI 24.30 kg/m  Physical Exam  Constitutional: She is oriented to person, place, and time. She appears well-developed and well-nourished. No distress.  HENT:  Head: Normocephalic.  Cardiovascular: Normal rate.   Pulmonary/Chest: Effort normal.  Abdominal: Soft.  Neurological: She is alert and oriented to person, place, and time.  Skin: Skin is warm and dry. No erythema.  Psychiatric: She has a normal mood and affect.  Vitals reviewed.  Labs and Imaging Quant hCG, CBC, TSH and antithromibin panel ordered/drawn today   Assessment & Plan:  Assessment: Recurrent SAB   Plans: Labs drawn today Condom use advised, patient declines other birth control at this time [facility] and management with MD or sooner if symptoms were to change or worsen  Marny LowensteinJulie N Wenzel, PA-C 01/03/2016 2:02 PM

## 2016-01-03 NOTE — Patient Instructions (Signed)
Aborto espontáneo  °(Miscarriage) ° El aborto espontáneo es la pérdida de un bebé que no ha nacido.(feto) antes de la semana 20 del embarazo. La causa generalmente es desconocida.  °CUIDADOS EN EL HOGAR  °· Debe permanecer en cama (reposo en cama) o podrá hacer actividades livianas. Regrese a sus actividades según las indicaciones del médico. °· Pida ayuda con las tareas domésticas. °· Anote cuántos apósitos usa por día. Describa el grado en que están empapados. °· No use tampones. No se higienice la vagina (duchas vaginales) ni tenga relaciones sexuales (coito) hasta que el médico la autorice. °· Sólo debe tomar la medicación según las indicaciones del médico. °· No tome aspirina. °· Cumpla con los controles médicos según las indicaciones. °· Si usted o su pareja tienen problemas con el duelo, hable con su médico. También puede intentar con psicoterapia. Permítase el tiempo suficiente de duelo antes de quedar embarazada nuevamente. °SOLICITE AYUDA DE INMEDIATO SI:  °· Siente cólicos intensos o dolor en el estómago, en la espalda o en el vientre (abdomen). °· Tiene fiebre. °· Elimina grumos de sangre (coágulos) por la vagina, que tienen el tamaño de una nuez o más. Guarde los coágulos para que el médico los vea. °· Elimina gran cantidad de tejidos por la vagina. Guarde lo que ha eliminado para que su médico lo examine. °· Aumenta el sangrado. °· Observa una secreción espesa, con mal olor (pérdida) que proviene de la vagina. °· Se siente mareada, débil o se desvanece (se desmaya). °· Siente escalofríos. °ASEGÚRESE DE QUE:  °· Comprende estas instrucciones. °· Controlará su enfermedad. °· Solicitará ayuda de inmediato si no mejora o si empeora. °  °Esta información no tiene como fin reemplazar el consejo del médico. Asegúrese de hacerle al médico cualquier pregunta que tenga. °  °Document Released: 11/06/2011 °Elsevier Interactive Patient Education ©2016 Elsevier Inc. ° °

## 2016-01-04 LAB — HCG, QUANTITATIVE, PREGNANCY: HCG, BETA CHAIN, QUANT, S: 6.1 m[IU]/mL — AB

## 2016-01-04 LAB — HOMOCYSTEINE: Homocysteine: 4.1 umol/L (ref <10.4)

## 2016-01-05 LAB — PROTEIN C ACTIVITY: PROTEIN C ACTIVITY: 121 % (ref 70–180)

## 2016-01-05 LAB — RFX PTT-LA W/RFX TO HEX PHASE CONF: PTT-LA Screen: 33 s (ref <=40)

## 2016-01-05 LAB — RFX DRVVT SCR W/RFLX CONF 1:1 MIX: dRVVT Screen: 42 s (ref <=45)

## 2016-01-05 LAB — ANTITHROMBIN III: ANTITHROMB III FUNC: 102 %{activity} (ref 80–120)

## 2016-01-05 LAB — LUPUS ANTICOAGULANT PANEL

## 2016-01-05 LAB — PROTEIN S, TOTAL: PROTEIN S ANTIGEN, TOTAL: 103 % (ref 70–140)

## 2016-01-05 LAB — PROTEIN C, TOTAL: Protein C Antigen: 103 % (ref 70–140)

## 2016-01-05 LAB — PROTEIN S ACTIVITY: PROTEIN S ACTIVITY: 84 % (ref 60–140)

## 2016-01-06 LAB — CARDIOLIPIN ANTIBODIES, IGG, IGM, IGA: Anticardiolipin IgA: <11 [APL'U]

## 2016-01-07 LAB — FACTOR 5 LEIDEN

## 2016-01-07 LAB — PROTHROMBIN GENE MUTATION

## 2016-01-09 LAB — BETA-2 GLYCOPROTEIN ANTIBODIES
Beta-2 Glyco I IgG: <9 SGU (ref <=20)
Beta-2-Glycoprotein I IgM: <9 SMU (ref <=20)

## 2016-01-12 ENCOUNTER — Other Ambulatory Visit: Payer: Self-pay

## 2016-01-12 ENCOUNTER — Telehealth: Payer: Self-pay | Admitting: General Practice

## 2016-01-12 DIAGNOSIS — O039 Complete or unspecified spontaneous abortion without complication: Secondary | ICD-10-CM

## 2016-01-12 NOTE — Telephone Encounter (Signed)
Per Raynelle FanningJulie, patient needs to return to office this week for bhcg. Called patient with Raquel for interpreter and informed patient of bhcg results & need to come in for repeat level. Patient verbalized understanding & asked if her last level was bad. Told patient no not necessarily, we just need her to come in for a repeat to ensure levels are continuing to drop. Patient verbalized understanding & states she can come today at 10:30am. Patient had no questions

## 2016-01-13 LAB — HCG, QUANTITATIVE, PREGNANCY

## 2016-01-17 ENCOUNTER — Telehealth: Payer: Self-pay

## 2016-01-17 NOTE — Telephone Encounter (Signed)
Please advise patient that hCG is now normal for non-pregnant. She could get pregnant any time now if she is having unprotected sex. She had told me she was unsure if she wanted to try again due to recurrent SABs, so if she is still concerned she should wait until after her appointment with MD in September to try to conceive.  Patient number is disconnected. I have sent her a letter to call us back.

## 2016-01-19 NOTE — Telephone Encounter (Signed)
Patient phone number is not in service

## 2016-01-20 ENCOUNTER — Ambulatory Visit (INDEPENDENT_AMBULATORY_CARE_PROVIDER_SITE_OTHER): Payer: Self-pay | Admitting: Obstetrics & Gynecology

## 2016-01-20 ENCOUNTER — Encounter: Payer: Self-pay | Admitting: Obstetrics & Gynecology

## 2016-01-20 VITALS — BP 118/79 | HR 67 | Wt 148.5 lb

## 2016-01-20 DIAGNOSIS — N96 Recurrent pregnancy loss: Secondary | ICD-10-CM

## 2016-01-20 NOTE — Patient Instructions (Signed)
[facility] [address], [place], [place] [address]   [phone]  1) Dr. April MansonYalcinkaya [facility] [phone] [place], [place] [address]  2) [facility]   [phone]   [address] [place] [address] [place], [place] [address]

## 2016-01-20 NOTE — Progress Notes (Signed)
Spanish video interpreter "Mare LoanCecilia" [phone]

## 2016-01-20 NOTE — Progress Notes (Signed)
GYNECOLOGY VISIT NOTE  History:  35 y.o. 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 She denies any abnormal vaginal discharge, bleeding, pelvic pain or other concerns.   Obstetric History   G6   P2   T2   P0   A4   L2    SAB0   TAB0   Ectopic0   Multiple0   Live Births2     # Outcome Date GA Lbr Len/2nd Weight Sex Delivery Anes PTL Lv  6 SAB 12/08/15 1071w0d         5 SAB 02/10/15 3071w0d         4 Term 12/24/10 7861w4d 09:18 / 01:55 7 lb 4.4 oz (3.3 kg) M Vag-Spont None  LIV     Name: LEON,BOY Kayana     Apgar1:  9                Apgar5: 9  3 SAB 11/02/09 5471w0d         2 Term 05/27/07 2557w0d  9 lb (4.082 kg) M CS-LTranv EPI  LIV  1 SAB 07/05/06 3071w0d             Past Medical History:  Diagnosis Date  . Abnormal Pap smear   . Blood type, Rh negative   . Gestational diabetes mellitus in pregnancy, diet-controlled   . Mucocele of salivary gland   . Rh incompatibility     Past Surgical History:  Procedure Laterality Date  . CESAREAN SECTION  05/27/07   CPD    The following portions of the patient's history were reviewed and updated as appropriate: allergies, current medications, past family history, past medical history, past social history, past surgical history and problem list.    Review of Systems:  Pertinent items noted in HPI and remainder of comprehensive ROS otherwise negative.  Objective:  Physical Exam BP 118/79 (BP Location: Left Arm, Patient Position: Sitting, Cuff Size: Normal)   Pulse 67   Wt 148 lb 8 oz (67.4 kg)   LMP 01/17/2016 (Exact Date)   Breastfeeding? No   BMI 24.71 kg/m  CONSTITUTIONAL: Well-developed, well-nourished female in no acute distress.  HENT:  Normocephalic, atraumatic. External right and left ear normal. Oropharynx is clear and moist EYES: Conjunctivae and EOM are normal. Pupils are equal, round, and reactive to light. No scleral icterus.  NECK: Normal range of motion, supple, no masses SKIN:  Skin is warm and dry. No rash noted. Not diaphoretic. No erythema. No pallor. NEUROLOGIC: Alert and oriented to person, place, and time. Normal reflexes, muscle tone coordination. No cranial nerve deficit noted. PSYCHIATRIC: Normal mood and affect. Normal behavior. Normal judgment and thought content. CARDIOVASCULAR: Normal heart rate noted RESPIRATORY: Effort and breath sounds normal, no problems with respiration noted ABDOMEN: Soft, no distention noted.   PELVIC: Deferred MUSCULOSKELETAL: Normal range of motion. No edema noted.  Labs and Imaging  Results for orders placed or performed in visit on 01/12/16 (from the past 1512 hour(s))  B-HCG Quant   Collection Time: 01/12/16 10:42 AM  Result Value Ref Range   hCG, Beta Chain, Quant, S <2.0 mIU/mL  Results for orders placed or performed in visit on 01/03/16 (from the past 1512 hour(s))  B-HCG Quant   Collection Time: 01/03/16  2:32 PM  Result Value Ref Range   hCG, Beta Chain, Quant, S 6.1 (H) mIU/mL  CBC   Collection Time: 01/03/16  2:32 PM  Result Value Ref Range  WBC 6.1 3.8 - 10.8 K/uL   RBC 4.52 3.80 - 5.10 MIL/uL   Hemoglobin 12.8 11.7 - 15.5 g/dL   HCT 40.9 81.1 - 91.4 %   MCV 85.2 80.0 - 100.0 fL   MCH 28.3 27.0 - 33.0 pg   MCHC 33.2 32.0 - 36.0 g/dL   RDW 78.2 95.6 - 21.3 %   Platelets 274 140 - 400 K/uL   MPV 10.3 7.5 - 12.5 fL  TSH   Collection Time: 01/03/16  2:32 PM  Result Value Ref Range   TSH 1.64 mIU/L  Antithrombin III   Collection Time: 01/03/16  2:32 PM  Result Value Ref Range   AntiThromb III Func 102 80 - 120 % activity  Protein C activity   Collection Time: 01/03/16  2:32 PM  Result Value Ref Range   Protein C Activity 121 70 - 180 %  Protein C, total   Collection Time: 01/03/16  2:32 PM  Result Value Ref Range   Protein C Antigen 103 70 - 140 %  Protein S activity   Collection Time: 01/03/16  2:32 PM  Result Value Ref Range   Protein S Activity 84 60 - 140 %  Protein S, total    Collection Time: 01/03/16  2:32 PM  Result Value Ref Range   PROTEIN S ANTIGEN, TOTAL 103 70 - 140 %  Lupus anticoagulant panel   Collection Time: 01/03/16  2:32 PM  Result Value Ref Range   Lupus Anticoagulant Eval REPORT   Homocysteine, serum   Collection Time: 01/03/16  2:32 PM  Result Value Ref Range   Homocysteine 4.1 <10.4 umol/L  Factor 5 leiden   Collection Time: 01/03/16  2:32 PM  Result Value Ref Range   Result REPORT    Interpretation REPORT    Reviewer REPORT   Prothrombin gene mutation   Collection Time: 01/03/16  2:32 PM  Result Value Ref Range   Result REPORT    Interpretation REPORT    Reviewer REPORT   Cardiolipin antibodies, IgG, IgM, IgA   Collection Time: 01/03/16  2:32 PM  Result Value Ref Range   Anticardiolipin IgA <11 APL   Anticardiolipin IgG <14 GPL   Anticardiolipin IgM <12 MPL  Beta-2 glycoprotein antibodies   Collection Time: 01/03/16  2:32 PM  Result Value Ref Range   Beta-2 Glyco I IgG <9 <=20 SGU   Beta-2-Glycoprotein I IgM <9 <=20 SMU   Beta-2-Glycoprotein I IgA <9 <=20 SAU  rfx dRVVT Scr w/rflx Conf 1:1 Mix   Collection Time: 01/03/16  2:32 PM  Result Value Ref Range   dRVVT Mix Interp. REPORT    dRVVT Screen 42 <=45 sec   dRVVT REPORT   rfx PTT-LA w/rfx to Hex Phase Conf   Collection Time: 01/03/16  2:32 PM  Result Value Ref Range   PTT-LA Screen 33 <=40 sec   Additional Testing REPORT   Results for orders placed or performed during the hospital encounter of 12/08/15 (from the past 1512 hour(s))  GC/Chlamydia probe amp ([place])not at [facility]   Collection Time: 12/08/15 12:00 AM  Result Value Ref Range   Chlamydia Negative    Neisseria gonorrhea Negative   Urinalysis, Routine w reflex microscopic (not at [facility])   Collection Time: 12/08/15  3:40 PM  Result Value Ref Range   Color, Urine STRAW (A) YELLOW   APPearance CLEAR CLEAR   Specific Gravity, Urine <1.005 (L) 1.005 - 1.030   pH 5.5 5.0 - 8.0  Glucose, UA NEGATIVE  NEGATIVE mg/dL   Hgb urine dipstick TRACE (A) NEGATIVE   Bilirubin Urine NEGATIVE NEGATIVE   Ketones, ur NEGATIVE NEGATIVE mg/dL   Protein, ur NEGATIVE NEGATIVE mg/dL   Nitrite NEGATIVE NEGATIVE   Leukocytes, UA NEGATIVE NEGATIVE  Urine microscopic-add on   Collection Time: 12/08/15  3:40 PM  Result Value Ref Range   Squamous Epithelial / LPF 0-5 (A) NONE SEEN   WBC, UA 0-5 0 - 5 WBC/hpf   RBC / HPF 0-5 0 - 5 RBC/hpf   Bacteria, UA NONE SEEN NONE SEEN  Pregnancy, urine POC   Collection Time: 12/08/15  4:03 PM  Result Value Ref Range   Preg Test, Ur POSITIVE (A) NEGATIVE  Wet prep, genital   Collection Time: 12/08/15  4:10 PM  Result Value Ref Range   Yeast Wet Prep HPF POC NONE SEEN NONE SEEN   Trich, Wet Prep NONE SEEN NONE SEEN   Clue Cells Wet Prep HPF POC NONE SEEN NONE SEEN   WBC, Wet Prep HPF POC FEW (A) NONE SEEN   Sperm NONE SEEN   hCG, quantitative, pregnancy   Collection Time: 12/08/15  4:22 PM  Result Value Ref Range   hCG, Beta Chain, Quant, S 45,798 (H) <5 mIU/mL  CBC with Differential   Collection Time: 12/08/15  4:22 PM  Result Value Ref Range   WBC 7.6 4.0 - 10.5 K/uL   RBC 4.46 3.87 - 5.11 MIL/uL   Hemoglobin 12.7 12.0 - 15.0 g/dL   HCT 16.1 09.6 - 04.5 %   MCV 81.8 78.0 - 100.0 fL   MCH 28.5 26.0 - 34.0 pg   MCHC 34.8 30.0 - 36.0 g/dL   RDW 40.9 81.1 - 91.4 %   Platelets 280 150 - 400 K/uL   Neutrophils Relative % 66 %   Neutro Abs 5.1 1.7 - 7.7 K/uL   Lymphocytes Relative 30 %   Lymphs Abs 2.3 0.7 - 4.0 K/uL   Monocytes Relative 3 %   Monocytes Absolute 0.2 0.1 - 1.0 K/uL   Eosinophils Relative 1 %   Eosinophils Absolute 0.1 0.0 - 0.7 K/uL   Basophils Relative 0 %   Basophils Absolute 0.0 0.0 - 0.1 K/uL  HIV antibody (routine testing) (NOT for [facility])   Collection Time: 12/08/15  4:22 PM  Result Value Ref Range   HIV Screen 4th Generation wRfx Non Reactive Non Reactive  Rh IG workup (includes ABO/Rh)   Collection Time: 12/08/15  4:22 PM    Result Value Ref Range   Gestational Age(Wks) 8.3    ABO/RH(D) B NEG    Antibody Screen NEG    Unit Number [id number]    Blood Component Type RHIG    Unit division 00    Status of Unit ISSUED,FINAL    Transfusion Status OK TO TRANSFUSE       Assessment & Plan:  History of recurrent miscarriages, not currently pregnant Negative evaluation so far. Results discussed in detail with patient with help of a Spanish interpreter. Patient desires evaluation by specialist, she was referred to a REI provider (Dr. April Manson vs [facility]) Routine preventative health maintenance measures emphasized. Please refer to After Visit Summary for other counseling recommendations.   Total face-to-face time with patient: 15 minutes. Over 50% of encounter was spent on counseling and coordination of care.   Jaynie Collins, MD, FACOG Attending Obstetrician & Gynecologist, [facility] for [employer], [facility]

## 2016-08-01 ENCOUNTER — Ambulatory Visit: Payer: Self-pay | Admitting: Family Medicine

## 2016-08-01 NOTE — Progress Notes (Deleted)
   Subjective:  Patient ID: Maria [name], female    DOB: May 15, 1981  Age: 36 y.o. MRN: [id number]  CC: No chief complaint on file.   HPI Maria [name] presents for ***  Outpatient Medications Prior to Visit  Medication Sig Dispense Refill  . misoprostol (CYTOTEC) 200 MCG tablet Take 4 tablets (800 mcg total) by mouth once. (Patient not taking: Reported on 01/03/2016) 4 tablet 0  . oxyCODONE-acetaminophen (PERCOCET/ROXICET) 5-325 MG tablet Take 2 tablets by mouth every 4 (four) hours as needed for severe pain. (Patient not taking: Reported on 01/20/2016) 15 tablet 0  . Prenatal Vit-Fe Fumarate-FA (MULTIVITAMIN-PRENATAL) 27-0.8 MG TABS tablet Take 1 tablet by mouth daily at 12 noon.     No facility-administered medications prior to visit.     ROS Review of Systems      Objective:  There were no vitals taken for this visit.  BP/Weight 01/20/2016 01/03/2016 12/08/2015  Systolic BP 118 119 141  Diastolic BP 79 70 73  Wt. (Lbs) 148.5 146 -  BMI 24.71 24.3 -     Physical Exam   Assessment & Plan:   Problem List Items Addressed This Visit    None      No orders of the defined types were placed in this encounter.   Follow-up: No Follow-up on file.   Lizbeth BarkMandesia R Orval Dortch FNP

## 2016-12-04 ENCOUNTER — Encounter: Payer: Self-pay | Admitting: Family Medicine

## 2016-12-04 ENCOUNTER — Ambulatory Visit (INDEPENDENT_AMBULATORY_CARE_PROVIDER_SITE_OTHER): Payer: No Typology Code available for payment source | Admitting: Family Medicine

## 2016-12-04 VITALS — BP 110/72 | HR 68 | Temp 98.8°F | Resp 12 | Ht 65.0 in | Wt 153.0 lb

## 2016-12-04 DIAGNOSIS — R21 Rash and other nonspecific skin eruption: Secondary | ICD-10-CM

## 2016-12-04 LAB — POCT URINALYSIS DIP (DEVICE)
Bilirubin Urine: NEGATIVE
Glucose, UA: NEGATIVE mg/dL
Hgb urine dipstick: NEGATIVE
Ketones, ur: NEGATIVE mg/dL
Nitrite: NEGATIVE
PROTEIN: NEGATIVE mg/dL
SPECIFIC GRAVITY, URINE: 1.02 (ref 1.005–1.030)
UROBILINOGEN UA: 0.2 mg/dL (ref 0.0–1.0)
pH: 6.5 (ref 5.0–8.0)

## 2016-12-04 MED ORDER — TRIAMCINOLONE ACETONIDE 0.1 % EX CREA: 1.0000 "application " | TOPICAL_CREAM | Freq: Two times a day (BID) | CUTANEOUS | 1 refills | Status: DC

## 2016-12-04 NOTE — Patient Instructions (Signed)
Triamcinolone skin cream, ointment, lotion, or aerosol Qu es PPL Corporationeste medicamento? La TRIAMCINOLONA es un corticosteroide. Ayuda a disminuir hinchazn, enrojecimiento y picazn que pueden presentarse con problemas de la piel. Este medicamento se [place] Islandsutiliza para el tratamiento de Therapist, artreacciones alrgicas de la piel. Este medicamento puede ser utilizado para otros usos; si tiene alguna pregunta consulte con su proveedor de atencin mdica o con su farmacutico. MARCAS COMUNES: Aristocort, Aristocort A, Aristocort HP, Cinalog, Cinolar, DERMASORB TA Complete, Flutex, Kenalog, Pediaderm TA, SP Rx 228, Triacet, Trianex, Triderm Wm. Wrigley Jr. CompanyQu le debo informar a mi profesional de la salud antes de tomar este medicamento? Necesita saber si usted presenta alguno de los Coventry Health Caresiguientes problemas o situaciones: -diabetes -infeccin, tuberculosis, herpes o infeccin mictica -grandes zonas de piel quemada o con lesiones -desgaste o adelgazamiento de la piel -una reaccin alrgica o inusual a la triamcinolona, a los corticosteroides, a otros medicamentos, alimentos, colorantes o conservantes -si est embarazada o buscando quedar embarazada -si est amamantando a un beb Cmo debo utilizar este medicamento? Este medicamento es slo para uso externo. No lo ingiera por va oral. Siga las instrucciones de la etiqueta del medicamento. Aplique una capa delgada de crema, pomada o espuma sobre la zona afectada. No la cubra con un vendaje o apsito a menos que se lo indique su mdico o su profesional de Radiographer, therapeuticla salud. Lvese las manos antes y despus de usarlo. No utilice sus medicamentos con una frecuencia mayor que la indicada. Evite que el medicamento entre en contacto con sus ojos. Si esto ocurre, enjuguelos con abundante agua fra del grifo. Si est utilizando un aerosol tpico, no inhale los vapores ni los use cerca de los ojos. No lo utilice cerca de una fuente de calor, llamas al descubierto o mientras fuma dado que el aerosol puede  prenderse fuego. Hable con su pediatra para informarse acerca del uso de este medicamento en nios. Puede requerir atencin especial. Sobredosis: Pngase en contacto inmediatamente con un centro toxicolgico o una sala de urgencia si usted cree que haya tomado demasiado medicamento. ATENCIN: Reynolds AmericanEste medicamento es solo para usted. No comparta este medicamento con nadie. Qu sucede si me olvido de una dosis? Si olvida una dosis, tmela lo antes posible. Si es casi la hora de la prxima dosis, tome slo esa dosis. No tome dosis adicionales o dobles. Qu puede interactuar con este medicamento? No se esperan interacciones. Puede ser que esta lista no menciona todas las posibles interacciones. Informe a su profesional de Beazer Homesla salud de [employer] Inctodos los productos a base de hierbas, medicamentos de Glyndonventa libre o suplementos nutritivos que est tomando. Si usted fuma, consume bebidas alcohlicas o si utiliza drogas ilegales, indqueselo tambin a su profesional de Beazer Homesla salud. Algunas sustancias pueden interactuar con su medicamento. A qu debo estar atento al usar PPL Corporationeste medicamento? Si los sntomas no mejoran durante una semana, informe a su mdico o a su profesional de Beazer Homesla salud. No debe utilizarlo durante ms de [address] das. No lo utilice sobre la piel sana o sobre grandes zonas de la piel. Informe a su mdico o a su profesional de la salud si est en contacto con personas con sarampin o varicela, o si desarrolla llagas o ampollas que no se curan bien. No cubra la zona afectada con un vendaje hermtico a menos que se lo indique su mdico o su profesional de Radiographer, therapeuticla salud. Si debe cubrir la zona, siga atentamente las instrucciones. Si cubre la zona donde aplic el medicamento puede aumentar la cantidad de crema que penetra en la  piel, lo cual aumenta el riesgo de tener efectos secundarios. Si est tratando la zona del paal de un nio, no la [place] con paales o con calzones de plstico ajustados. Esto puede aumentar la cantidad de  medicamento que penetra en la piel, lo cual aumenta el riesgo de tener efectos secundarios. Qu efectos secundarios puedo tener al [employer] este medicamento? Efectos secundarios que debe informar a su mdico o a Producer, television/film/video de la salud tan pronto como sea posible: -ardor o picazn de la piel -manchas rojas oscuras en la piel -infeccin -formacin de ampollas llenas de pus, rojas y dolorosas en los folculos pilosos -adelgazamiento de la piel, ms probabilidad de quemaduras de sol especialmente en la cara Efectos secundarios que, por lo general, no requieren atencin mdica (debe informarlos a su mdico o a su profesional de la salud si persisten o si son molestos): -piel seca, irritacin -crecimiento inusual del pelo o vello en el rostro o cuerpo Puede ser que esta lista no menciona todos los posibles efectos secundarios. Comunquese a su mdico por asesoramiento mdico [employer]. Usted puede informar los efectos secundarios a la FDA por telfono al 1-800-FDA-1088. Dnde debo guardar mi medicina? Mantngala fuera del alcance de los nios. Gurdela a San[employer]-SCI, entre 15 y 30 grados C (80 y 22 grados F). No la congele. Deseche los medicamentos que no haya utilizado, despus de la fecha de vencimiento. ATENCIN: Este folleto es un resumen. Puede ser que no cubra toda la posible informacin. Si usted tiene preguntas acerca de esta medicina, consulte con su mdico, su farmacutico o su profesional de Radiographer, therapeutic.  2018 Elsevier/Gold Standard (2014-06-29 00:00:00)
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
## 2016-12-18 ENCOUNTER — Encounter: Payer: Self-pay | Admitting: Family Medicine

## 2016-12-18 ENCOUNTER — Ambulatory Visit (INDEPENDENT_AMBULATORY_CARE_PROVIDER_SITE_OTHER): Payer: Self-pay | Admitting: Family Medicine

## 2016-12-18 VITALS — BP 104/60 | HR 60 | Temp 98.7°F | Resp 14 | Ht 65.0 in | Wt 151.6 lb

## 2016-12-18 DIAGNOSIS — Z7189 Other specified counseling: Secondary | ICD-10-CM

## 2016-12-18 DIAGNOSIS — Z7185 Encounter for immunization safety counseling: Secondary | ICD-10-CM

## 2016-12-18 DIAGNOSIS — Z23 Encounter for immunization: Secondary | ICD-10-CM

## 2016-12-18 DIAGNOSIS — R21 Rash and other nonspecific skin eruption: Secondary | ICD-10-CM

## 2016-12-18 NOTE — Progress Notes (Signed)
Patient ID: Maria [name], female    DOB: September 08, 1980, 36 y.o.   MRN: [id number]  PCP: Maria Jun, FNP  Chief Complaint  Patient presents with  . Follow-up    rash    Subjective:  HPI [id number] [name] is a 36 y.o. female presents for evaluation of rash of the abdomen. She was previously seen and evaluated for abdominal rash on her abdomen, 12/04/2016. Maria [name] reports that the ointment previously prescribed resolved the irritation and rash. Maria [name] was previously evaluated for immunizations at the health department. She has a form present in office today which lists that overdue vaccinations: Varicella, Hepatitis A&B, and  MMR. TDAP is current.  Social History   Social History  . Marital status: Married    Spouse name: Maria [name]  . Number of children: 2  . Years of education: 9   Occupational History  .  Unemployed   Social History Main Topics  . Smoking status: Never Smoker  . Smokeless tobacco: Never Used  . Alcohol use No  . Drug use: No  . Sexual activity: Not Currently    Partners: Male    Birth control/ protection: None   Other Topics Concern  . Not on file   Social History Narrative   Lives in [place] with husband, Maria [name], [place], [place] 05/27/07   Son, Maria [name], DOB 12/24/2010    Family History  Problem Relation Age of Onset  . Hearing loss Sister   . Diabetes Mother   . Diabetes Father   . Diabetes Maternal Grandmother    Review of Systems See HPI  Patient Active Problem List   Diagnosis Date Noted  . History of recurrent miscarriages, not currently pregnant 01/03/2016  . Anemia 08/16/2014  . Back pain, thoracic 09/08/2013  . Rash and nonspecific skin eruption 09/07/2013  . Visit for annual health examination 09/07/2013  . SAB (spontaneous abortion) 04/05/2012  . Cervical cancer screening 02/19/2011  . MUCOCELE, SALIVARY GLAND 02/20/2010  . Rh negative, maternal 01/30/2010    No Known  Allergies  Prior to Admission medications   Medication Sig Start Date End Date Taking? Authorizing Provider  oxyCODONE-acetaminophen (PERCOCET/ROXICET) 5-325 MG tablet Take 2 tablets by mouth every 4 (four) hours as needed for severe pain. 12/08/15  Yes West Pugh, NP  Prenatal Vit-Fe Fumarate-FA (MULTIVITAMIN-PRENATAL) 27-0.8 MG TABS tablet Take 1 tablet by mouth daily at 12 noon.   Yes [provider]  triamcinolone cream (KENALOG) 0.1 % Apply 1 application topically 2 (two) times daily. 12/04/16  Yes Maria Jun, FNP  misoprostol (CYTOTEC) 200 MCG tablet Take 4 tablets (800 mcg total) by mouth once. Patient not taking: Reported on 12/18/2016 12/08/15   West Pugh, NP    Past Medical, Surgical Family and Social History reviewed and updated.    Objective:   Today's Vitals   12/18/16 1444  BP: 104/60  Pulse: 60  Resp: 14  Temp: 98.7 F (37.1 C)  TempSrc: Oral  SpO2: 100%  Weight: 151 lb 9.6 oz (68.8 kg)  Height: 5' 5"  (1.651 m)    Wt Readings from Last 3 Encounters:  12/18/16 151 lb 9.6 oz (68.8 kg)  12/04/16 153 lb (69.4 kg)  01/20/16 148 lb 8 oz (67.4 kg)    Physical Exam  Constitutional: She appears well-developed and well-nourished.  HENT:  Head: Normocephalic and atraumatic.  Eyes: Pupils are equal, round, and reactive to light.  Neck: Normal range of  motion. Neck supple.  Cardiovascular: Normal rate, regular rhythm, normal heart sounds and intact distal pulses.   Pulmonary/Chest: Effort normal and breath sounds normal.  Abdominal:    Skin: Skin is warm and dry.  Psychiatric: She has a normal mood and affect. Her behavior is normal. Judgment and thought content normal.   Assessment & Plan:  1. Rash, resolved. 2. Need for hepatitis B vaccination - Hepatitis B vaccine adult IM, return for remaining 2 vaccinations.  3. Immunization counseling MMR, Hepatitis A , obtain at the health department.  RTC: 6 months for wellness  visit.  [place] [name]. Maria Kingfisher, MSN, FNP-C [facility]  [address] [place], [place] [address] [phone]

## 2016-12-18 NOTE — Patient Instructions (Signed)
Calendario de vacunacin - Adultos  (Immunization Schedule, Adult)  [place] antigripal. ? Todos los adultos deben vacunarse todos los [place]. ? [employer], incluidas las mujeres embarazadas y las personas con urticaria por alergia al huevo pueden recibir la vacuna antigripal inactivada (IIV). ? Los [employer] 18 y 26 aos pueden recibir la vacuna contra la influenza recombinante (RIV). La vacuna RIV no contiene protenas del huevo. ? Los adultos de 65 aos o ms pueden recibir la dosis de IIV estndar o IIV en dosis altas.  Vacuna contra difteria, ttanos y tos Dietitian (Td,Tdap). ? Las mujeres embarazadas deben recibir 1 dosis de vacuna Tdap en cada embarazo. La dosis debe aplicarse independientemente del tiempo transcurrido desde la ltima dosis. Se prefiere la vacunacin entre las semanas 27 y 30 de gestacin. ? Un adulto que no ha recibido Garment/textile technologist vacuna Tdap o que no sabe su estado de vacunacin deben recibir 1 dosis. Esta dosis inicial debe ser seguida por una dosis de refuerzo de toxoides tetnico y diftrico (Td) cada 10 aos. ? Los adultos con una historia desconocida o incompleta de vacunacin de 3 dosis de las vacunas Td deben comenzar o completar una serie de vacunas primaria incluyendo una dosis de Tdap. ? Los adultos deben recibir una dosis de refuerzo de Td cada 10 aos.  Vacuna contra la varicela. ? Todos los adultos sin evidencia de inmunidad a la varicela deben recibir 2 dosis o una segunda dosis si slo han recibido 1 dosis. ? Las mujeres embarazadas que no tengan evidencia de inmunidad deben recibir la primera dosis despus del Media planner. La primera dosis debe aplicarse antes de abandonar el establecimiento sanitario. La segunda dosis debe aplicarse de 4 a 8 semanas despus de la primera dosis.  Vacuna contra el virus del Engineer, technical sales (VPH). ? Las mujeres de 13 a 26 aos que no han recibido la vacuna previamente deben recibir Ardelia Mems serie de 3 dosis. ? La  vacuna no se recomienda en mujeres embarazadas. Sin embargo, no es Chartered loss adjuster una prueba de [place] antes de recibir una dosis. Si se comprueba que una mujer est embarazada despus de haber recibido Ardelia Mems dosis, no necesita tratamiento. En ese caso, las dosis restantes deben retrasarse hasta despus del embarazo. ? Los hombres de 13 a 21 aos que no han recibido la vacuna previamente deben recibir Ardelia Mems serie de 3 dosis. Los [employer] 22 y 9 aos deben vacunarse. ? Se recomienda [employer] [address] a todos los hombres que tengan sexo con hombres y que no recibieron ninguna dosis antes. ? Se recomienda la aplicacin de la vacuna a cualquier persona con una enfermedad por inmunodeficiencia [employer] 26 aos, si no recibi [place] o ninguna de las dosis [place]. ? Durante la serie de 3 dosis, la segunda dosis debe aplicarse de 4 a 8 semanas despus de la primera dosis. La tercera dosis debe aplicarse 24 semanas despus de la primera dosis y 16 semanas despus de la segunda dosis.  Vacuna contra el herpes zoster. ? Se recomienda una dosis en adultos mayores de 17 aos a menos que sufran ciertas enfermedades.  Vacuna triple viral (sarampin, paperas y [place]) o MMR por su siglas en ingls. ? Los adultos nacidos antes de 1957 generalmente se consideran inmunes al sarampin y las paperas. ? Los adultos nacidos en 1957 o ms tarde deben recibir 1 o ms dosis de la vacuna MMR, a menos que [employer] contraindicacin para la vacuna o que tengan prueba de  inmunidad a las [employer]. ? Los estudiantes que asisten a escuelas de educacin superior, los trabajadores de la salud o los viajeros internacionales deben aplicarse una segunda dosis de rutina de la vacuna MMR por lo menos 28 das despus de la primera dosis. ? [employer] recibieron la vacuna inactivada contra el sarampin o un tipo desconocido de [employer] y1965 deben recibir 2 dosis de la  vacuna MMR. ? Las [employer] recibieron la vacuna inactivada contra las paperas o un tipo desconocido de vacuna contra las paperas antes de 1979 y se encuentran en alto riesgo de infeccin por paperas deben considerar la vacunacin con 2 dosis de la vacuna MMR. ? En las mujeres en edad frtil, debe determinarse la inmunidad contra la rubola. Si no hay prueba de inmunidad, las mujeres que no estn embarazadas deben vacunarse. Si no hay prueba de inmunidad, las mujeres que estn embarazadas deben posponer la vacunacin hasta despus de su embarazo. ? Los trabajadores de [employer]	[place] no vacunados nacidos antes de 1957 que no tengan evidencia de laboratorio de sarampin de inmunidad a las paperas o rubola o la confirmacin de laboratorio de la enfermedad deben considerar vacunarse contra el sarampin y las paperas con 2 dosis de la vacuna MMR o vacunarse contra la rubola con 1 dosis de la vacuna MMR.  Vacuna antineumoccica conjugada 13 valente (PCV13). ? Cuando est indicado, una persona que tenga dudas sobre su historial de vacunacin y no tenga antecedentes de vacunacin, debe recibir la vacuna PCV13. ? Un adulto de19 aos o ms, que sufra ciertas enfermedades y no haya sido vacunado previamente deben recibir 1 dosis de la vacuna PCV13. Despus de la OTL57 debe aplicarse una dosis de la vacuna antineumoccica de polisacridos (PPSV23). La dosis de la vacuna WIOM35 se debe aplicar por lo menos 8 semanas despus de la dosis de la vacuna PCV13. ? Un adulto de 19 aos o ms que sufra ciertas enfermedades y que haya recibido anteriormente1 o ms dosis de la vacuna PPSV23 debe recibir 1 dosis de PCV13. La dosis de la vacuna DHR41 se debe aplicar 1 o ms aos despus de la ltima dosis de la vacuna PPSV23.  Vacuna antineumoccica de polisacridos (PPSV23). ? Cuando se indica la vacuna ULA45, debe aplicarse primero. ? [employer] de 65 aos o mayores deben recibir 1 dosis. ? Los [employer] de 65 aos  que sufran ciertas enfermedades, deben vacunarse. ? Cualquier persona que resida en un hogar de ancianos o centro de atencin permanente debe vacunarse. ? Un fumador adulto debe vacunarse. ? Las personas con ciertas enfermedades por inmunodeficiencia y Scientist, research (medical) enfermedades deben recibir tanto la PCV13 como la PPSV23. ? Las personas con el virus de inmunodeficiencia humana ([place] ) deben vacunarse tan pronto como sea posible despus del diagnstico. ? La vacunacin durante la quimioterapia o la terapia de radiacin debe evitarse. ? No se recomienda el uso rutinario de la vacuna PPSV23 en los indoamericanos, los nativos de [place] o personas menores de 12 aos excepto que existan condiciones mdicas que requieren la vacuna PPSV23. ? Cuando est indicado, las personas que no saben si han sido vacunadas y no tienen antecedentes de vacunacin deben recibir la vacuna PPSV23. ? Se recomienda a las personas entre 19 y 40 aos con insuficiencia renal crnica, sndrome nefrtico, asplenia o pacientes inmunocomprometidos, la revacunacin por nica vez 5 aos despus de la primera dosis de PPSV23. ? [employer] recibieron 1 o 2 dosis de  PPSV23 antes de los 65 aos de edad deben recibir otra dosis a los 65 aos o ms tarde si han pasado al menos 5 aos desde la ltima dosis. ? Las dosis de PPSV23 no son necesarias para las personas vacunadas despus de la edad de 9 aos.  Vacuna antimeningoccica. ? Los adultos con asplenia o deficiencias persistentes de componentes complementarios deben recibir 2 dosis de la vacuna cuadrivalente meningoccica conjugada (MenACWY-D). Estas dosis deben administrarse al menos con 2 meses de diferencia. ? Los microbilogos que trabajan con ciertas bacterias meningoccicas, los militares reclutados, las personas en riesgo durante un brote y los que viajen o vivan en pases con alta tasa de meningitis, deben vacunarse. ? Los estudiantes universitarios del Tourist information centre manager la edad de 21  aos que viven en residencias deben recibir 1 dosis si no la recibieron durante o despus de su cumpleaos nmero 16. ? Los adultos que sufren ciertas enfermedades de alto riesgo deben recibir una o ms dosis de la vacuna.  Vacuna contra la hepatitis A. ? Los adultos que deseen estar protegidos contra esta enfermedad, que sufren ciertas enfermedades de alto riesgo, los que trabajan con animales infectados con hepatitis A, los que trabajan en los laboratorios de investigacin de hepatitis A o que viajan o trabajan en pases con una alta tasa de hepatitis A deben vacunarse. ? Los adultos que no fueron vacunados previamente y que Lucianne Lei a tener un contacto cercano con una persona adoptada fuera del pas, deben recibir la vacuna durante los primeros [address] despus de su llegada a los [place] desde un pas con una alta tasa de hepatitis A.  Vacuna contra la hepatitis B. ? Los adultos que deseen estar protegidos contra esta enfermedad, que sufren ciertas enfermedades de alto riesgo, que puedan estar expuestos a sangre u otros fluidos corporales infecciosos, que tienen contactos familiares o parejas sexuales con hepatitis B positivo, que sean clientes o trabajadores de ciertos centros de atencin, o que viajan o trabajan en pases con una alta tasa de hepatitis B deben vacunarse.  Haemophilus influenzae tipo b (Hib). ? Una persona que no ha sido vacunada que sufre asplenia o anemia de clulas falciformes o tiene una esplenectoma programada, debe recibir 1 dosis de la vacuna Hib. ? Independientemente de la vacunacin anterior, un receptor de un trasplante de clulas madre hematopoyticas debe recibir una serie de 3 dosis, 6 a12 meses despus de su trasplante exitoso. ? La vacuna Hib no se recomienda para los adultos con infeccin por VIH. Esta informacin no tiene Marine scientist el consejo del mdico. Asegrese de hacerle al mdico cualquier pregunta que tenga. Document Released: 05/07/2005  Document Revised: 09/01/2012 Elsevier Interactive Patient Education  2017 [employer].

## 2017-06-21 ENCOUNTER — Ambulatory Visit: Payer: No Typology Code available for payment source | Admitting: Family Medicine

## 2017-10-03 ENCOUNTER — Encounter (HOSPITAL_COMMUNITY): Payer: Self-pay | Admitting: Obstetrics and Gynecology

## 2017-10-03 ENCOUNTER — Other Ambulatory Visit: Payer: Self-pay

## 2017-10-03 ENCOUNTER — Inpatient Hospital Stay (HOSPITAL_COMMUNITY)
Admission: AD | Admit: 2017-10-03 | Discharge: 2017-10-03 | Disposition: A | Discharge status: Home / Self Care | Payer: Self-pay | Source: Ambulatory Visit | Attending: Obstetrics and Gynecology | Admitting: Obstetrics and Gynecology

## 2017-10-03 ENCOUNTER — Inpatient Hospital Stay (HOSPITAL_COMMUNITY): Payer: Self-pay

## 2017-10-03 DIAGNOSIS — O4690 Antepartum hemorrhage, unspecified, unspecified trimester: Secondary | ICD-10-CM

## 2017-10-03 DIAGNOSIS — O208 Other hemorrhage in early pregnancy: Secondary | ICD-10-CM | POA: Insufficient documentation

## 2017-10-03 DIAGNOSIS — Z3A01 Less than 8 weeks gestation of pregnancy: Secondary | ICD-10-CM | POA: Insufficient documentation

## 2017-10-03 DIAGNOSIS — Z8632 Personal history of gestational diabetes: Secondary | ICD-10-CM | POA: Insufficient documentation

## 2017-10-03 DIAGNOSIS — O4691 Antepartum hemorrhage, unspecified, first trimester: Secondary | ICD-10-CM

## 2017-10-03 DIAGNOSIS — O418X10 Other specified disorders of amniotic fluid and membranes, first trimester, not applicable or unspecified: Secondary | ICD-10-CM

## 2017-10-03 DIAGNOSIS — Z822 Family history of deafness and hearing loss: Secondary | ICD-10-CM | POA: Insufficient documentation

## 2017-10-03 DIAGNOSIS — Z3490 Encounter for supervision of normal pregnancy, unspecified, unspecified trimester: Secondary | ICD-10-CM

## 2017-10-03 DIAGNOSIS — Z833 Family history of diabetes mellitus: Secondary | ICD-10-CM | POA: Insufficient documentation

## 2017-10-03 DIAGNOSIS — O468X1 Other antepartum hemorrhage, first trimester: Secondary | ICD-10-CM

## 2017-10-03 LAB — CBC
HCT: 39.8 % (ref 36.0–46.0)
HEMOGLOBIN: 13.9 g/dL (ref 12.0–15.0)
MCH: 29.2 pg (ref 26.0–34.0)
MCHC: 34.9 g/dL (ref 30.0–36.0)
MCV: 83.6 fL (ref 78.0–100.0)
PLATELETS: 302 10*3/uL (ref 150–400)
RBC: 4.76 MIL/uL (ref 3.87–5.11)
RDW: 12.9 % (ref 11.5–15.5)
WBC: 11.2 10*3/uL — ABNORMAL HIGH (ref 4.0–10.5)

## 2017-10-03 LAB — WET PREP, GENITAL
Clue Cells Wet Prep HPF POC: NONE SEEN
Sperm: NONE SEEN
Trich, Wet Prep: NONE SEEN
Yeast Wet Prep HPF POC: NONE SEEN

## 2017-10-03 LAB — URINALYSIS, ROUTINE W REFLEX MICROSCOPIC
Bilirubin Urine: NEGATIVE
Glucose, UA: NEGATIVE mg/dL
KETONES UR: NEGATIVE mg/dL
Nitrite: NEGATIVE
PROTEIN: NEGATIVE mg/dL
Specific Gravity, Urine: 1.009 (ref 1.005–1.030)
pH: 7 (ref 5.0–8.0)

## 2017-10-03 LAB — HCG, QUANTITATIVE, PREGNANCY: HCG, BETA CHAIN, QUANT, S: 4535 m[IU]/mL — AB (ref <5)

## 2017-10-03 LAB — POCT PREGNANCY, URINE: PREG TEST UR: POSITIVE — AB

## 2017-10-03 MED ORDER — RHO D IMMUNE GLOBULIN 1500 UNIT/2ML IJ SOSY
300.0000 ug | PREFILLED_SYRINGE | Freq: Once | INTRAMUSCULAR | Status: AC
  Administered 2017-10-03: 300 ug via INTRAMUSCULAR
  Filled 2017-10-03: qty 2

## 2017-10-03 NOTE — MAU Note (Signed)
Pt had positive HPT.  Went to [facility] today and had bleeding when she wiped. Denies any pain at this time.

## 2017-10-03 NOTE — MAU Provider Note (Signed)
History     CSN: [id number]  Arrival date and time: 10/03/17 1414   First Provider Initiated Contact with Patient 10/03/17 1722 - assessment and exam done with assistance from [place], in-house [employer] Complaint  Patient presents with  . Vaginal Bleeding   HPI  Ms.  Maria [name] is a 37 y.o. year old [phone] female at Unknown weeks gestation who presents to MAU reporting (+) HPT and VB today with wiping. She denies pain at this time. She experienced something similar last year where she had a IUGS and no baby. She is worried that this is happening again this time.  Past Medical History:  Diagnosis Date  . Abnormal Pap smear   . Blood type, Rh negative   . Gestational diabetes mellitus in pregnancy, diet-controlled   . Mucocele of salivary gland   . Rh incompatibility     Past Surgical History:  Procedure Laterality Date  . CESAREAN SECTION  05/27/07   CPD    Family History  Problem Relation Age of Onset  . Hearing loss Sister   . Diabetes Mother   . Diabetes Father   . Diabetes Maternal Grandmother     Social History   Tobacco Use  . Smoking status: Never Smoker  . Smokeless tobacco: Never Used  Substance Use Topics  . Alcohol use: No  . Drug use: No    Allergies: No Known Allergies  Medications Prior to Admission  Medication Sig Dispense Refill Last Dose  . misoprostol (CYTOTEC) 200 MCG tablet Take 4 tablets (800 mcg total) by mouth once. (Patient not taking: Reported on 12/18/2016) 4 tablet 0 Not Taking  . oxyCODONE-acetaminophen (PERCOCET/ROXICET) 5-325 MG tablet Take 2 tablets by mouth every 4 (four) hours as needed for severe pain. (Patient not taking: Reported on 12/18/2016) 15 tablet 0 Not Taking  . Prenatal Vit-Fe Fumarate-FA (MULTIVITAMIN-PRENATAL) 27-0.8 MG TABS tablet Take 1 tablet by mouth daily at 12 noon.   Not Taking  . triamcinolone cream (KENALOG) 0.1 % Apply 1 application topically 2 (two) times daily. 80 g 1 Taking     Review of Systems  Constitutional: Negative.   HENT: Negative.   Eyes: Negative.   Respiratory: Negative.   Cardiovascular: Negative.   Gastrointestinal: Negative.   Endocrine: Negative.   Genitourinary: Positive for vaginal bleeding.  Musculoskeletal: Negative.   Skin: Negative.   Allergic/Immunologic: Negative.   Neurological: Negative.   Hematological: Negative.   Psychiatric/Behavioral: Negative.    Physical Exam   Blood pressure 133/90, pulse 85, temperature 98.2 F (36.8 C), temperature source Oral, resp. rate 16, height  (1.626 m), weight 152 lb (68.9 kg), last menstrual period 08/07/2017.  Physical Exam  Nursing note and vitals reviewed. Constitutional: She is oriented to person, place, and time. She appears well-developed and well-nourished.  HENT:  Head: Normocephalic and atraumatic.  Eyes: Pupils are equal, round, and reactive to light.  Neck: Normal range of motion.  Cardiovascular: Normal rate.  Respiratory: Effort normal.  GI: Soft.  Genitourinary:  Genitourinary Comments: Uterus: non-tender, SE: cervix is smooth, pink, no lesions, small amt of dark, brownish-red blood in vaginal vault -- WP, GC/CT done, closed/long/firm, no CMT or friability, no adnexal tenderness   Musculoskeletal: Normal range of motion.  Neurological: She is alert and oriented to person, place, and time.  Skin: Skin is warm and dry.  Psychiatric: She has a normal mood and affect. Her behavior is normal. Judgment and thought content normal.  MAU Course  Procedures  MDM CCUA UPT CBC ABO/Rh HCG Wet Prep GC/CT -- pending HIV -- pending OB < 14 wks [place] with TV Rhophylac 300 mcg IM injection  Results for orders placed or performed during the hospital encounter of 10/03/17 (from the past 24 hour(s))  Urinalysis, Routine w reflex microscopic     Status: Abnormal   Collection Time: 10/03/17  2:50 PM  Result Value Ref Range   Color, Urine STRAW (A) YELLOW   APPearance  HAZY (A) CLEAR   Specific Gravity, Urine 1.009 1.005 - 1.030   pH 7.0 5.0 - 8.0   Glucose, UA NEGATIVE NEGATIVE mg/dL   Hgb urine dipstick MODERATE (A) NEGATIVE   Bilirubin Urine NEGATIVE NEGATIVE   Ketones, ur NEGATIVE NEGATIVE mg/dL   Protein, ur NEGATIVE NEGATIVE mg/dL   Nitrite NEGATIVE NEGATIVE   Leukocytes, UA TRACE (A) NEGATIVE   RBC / HPF 0-5 0 - 5 RBC/hpf   WBC, UA 0-5 0 - 5 WBC/hpf   Bacteria, UA RARE (A) NONE SEEN   Squamous Epithelial / LPF 11-20 0 - 5  Pregnancy, urine POC     Status: Abnormal   Collection Time: 10/03/17  3:29 PM  Result Value Ref Range   Preg Test, Ur POSITIVE (A) NEGATIVE  CBC     Status: Abnormal   Collection Time: 10/03/17  3:56 PM  Result Value Ref Range   WBC 11.2 (H) 4.0 - 10.5 K/uL   RBC 4.76 3.87 - 5.11 MIL/uL   Hemoglobin 13.9 12.0 - 15.0 g/dL   HCT 16.1 09.6 - 04.5 %   MCV 83.6 78.0 - 100.0 fL   MCH 29.2 26.0 - 34.0 pg   MCHC 34.9 30.0 - 36.0 g/dL   RDW 40.9 81.1 - 91.4 %   Platelets 302 150 - 400 K/uL  hCG, quantitative, pregnancy     Status: Abnormal   Collection Time: 10/03/17  3:56 PM  Result Value Ref Range   hCG, Beta Chain, Quant, S 4,535 (H) <5 mIU/mL  Rh IG workup (includes ABO/Rh)     Status: None (Preliminary result)   Collection Time: 10/03/17  3:56 PM  Result Value Ref Range   Gestational Age(Wks) 8    ABO/RH(D) B NEG    Antibody Screen NEG    Unit Number [id number]    Blood Component Type RHIG    Unit division 00    Status of Unit REL FROM [facility]    Transfusion Status OK TO TRANSFUSE    Unit Number [id number]    Blood Component Type RHIG    Unit division 00    Status of Unit ISSUED    Transfusion Status      OK TO TRANSFUSE Performed at [facility], [address], [place], [place] [address]   Wet prep, genital     Status: Abnormal   Collection Time: 10/03/17  5:43 PM  Result Value Ref Range   Yeast Wet Prep HPF POC NONE SEEN NONE SEEN   Trich, Wet Prep NONE SEEN NONE SEEN   Clue Cells Wet Prep  HPF POC NONE SEEN NONE SEEN   WBC, Wet Prep HPF POC FEW (A) NONE SEEN   Sperm NONE SEEN     US Ob Less Than 14 Weeks With Ob Transvaginal  Result Date: 10/03/2017 CLINICAL DATA:  Vaginal bleeding, quantitative HCG 4,535 EXAM: OBSTETRIC <14 WK [place] AND TRANSVAGINAL OB US TECHNIQUE: Both transabdominal and transvaginal ultrasound examinations were performed for complete evaluation of the gestation as  well as the maternal uterus, adnexal regions, and pelvic cul-de-sac. Transvaginal technique was performed to assess early pregnancy. COMPARISON:  None. FINDINGS: Intrauterine gestational sac: Single intrauterine gestational sac Yolk sac:  Visible Embryo:  Not visible MSD: 9.5 mm   5 w   5 d Subchorionic hemorrhage:  Small subchorionic hemorrhage. Maternal uterus/adnexae: Ovaries are within normal limits. Left ovary measures 2.7 x 1.5 x 1 point 7 cm. Right ovary measures 2.5 x 3.6 x 2.3 cm and contains a corpus luteal cyst. No significant free fluid. IMPRESSION: 1. Single intrauterine pregnancy with visible gestational and yolk sac but no embryo. Consider follow-up sonography in 10-14 days to confirm viability. 2. Small subchorionic hemorrhage Electronically Signed   By: Jasmine Pang M.D.   On: 10/03/2017 18:36    Assessment and Plan  Early stage of pregnancy  - Outpatient Viability OB US in 2 wks  - F/U with [facility]  Vaginal bleeding in pregnancy  - Bleeding precautions reviewed - Information provided on VB in 1st trimester   Subchorionic hematoma in first trimester, single or unspecified fetus  - Discussed that the amount of dark brown VB seen on exam is c/w [facility] and is normal - Advised that heavy BRB is not normal and she should return to MAU immediately - Information provided on [facility]  - Discharge patient - Patient verbalized an understanding of the plan of care and agrees.   * Results and discharge instructions reviewed using assistance of [place], in-house Spanish Interpreter    Raelyn Mora,  MSN, CNM 10/03/2017, 5:43 PM

## 2017-10-03 NOTE — Discharge Instructions (Signed)
You can take Tylenol 1000 mg every 6 hrs as needed for pain  Las medicinas seguras para tomar Academic librarian  Safe Medications in Pregnancy  Acn:  Benzoyl Peroxide (Perxido de benzolo)  Salicylic Acid (cido saliclico)  Dolor de espalda/Dolor de cabeza:  Tylenol: 2 pastillas de concentracin regular cada 4 horas O 2 pastillas de concentracin fuerte cada 6 horas  Resfriados/Tos/Alergias:  Benadryl (sin alcohol) 25 mg cada 6 horas segn lo necesite Breath Right strips (Tiras para respirar correctamente)  Claritin  Cepacol (pastillas de chupar para la garganta)  Chloraseptic (aerosol para la garganta)  Cold-Eeze- hasta tres veces por da  Cough drops (pastillas de chupar para la tos, sin alcohol)  Flonase (con receta mdica solamente)  Guaifenesin  Mucinex  Robitussin DM (simple solamente, sin alcohol)  Saline nasal spray/drops (Aerosol nasal salino/gotas) Sudafed (pseudoephedrine) y  Actifed * utilizar slo despus de 12 semanas de gestacin y si no tiene la presin arterial alta.  Tylenol Vicks  VapoRub  Zinc lozenges (pastillas para la garganta)  Zyrtec  Estreimiento:  Colace  Ducolax (supositorios)  Fleet enema (lavado intestinal rectal)  Glycerin (supositorios)  Metamucil  Milk of magnesia (leche de magnesia)  Miralax  Senokot  Smooth Move (t)  Diarrea:  Kaopectate Imodium A-D  *NO tome Pepto-Bismol  Hemorroides:  Anusol  Anusol HC  Preparation H  Tucks  Indigestin:  Tums  Maalox  Mylanta  Zantac  Pepcid  Insomnia:  Benadryl (sin alcohol)  cada 6 horas segn lo necesite  Tylenol PM  Unisom, no Gelcaps  Calambres en las piernas:  Tums  MagGel Nuseas/Vmitos:  Bonine  Dramamine  Emetrol  Ginger (extracto)  Sea-Bands  Meclizine  Medicina para las nuseas que puede tomar durante el embarazo: Unisom (doxylamine succinate, pastillas de 25 mg) Tome una pastilla al da al [place]. Si los sntomas no estn adecuadamente controlados, la  dosis puede aumentarse hasta una dosis mxima recomendada de [employer] al da (1/2 pastilla por la [place], 1/2 pastilla a media tarde y Neomia Dear pastilla al [place]). Pastillas de Vitamina B6 de . Tome [employer] veces al da (hasta 200 mg por da).  Erupciones en la piel:  Productos de Aveeno  Benadryl cream (crema o una dosis de  cada 6 horas segn lo necesite)  Calamine Lotion (locin)  1% cortisone cream (crema de cortisona de 1%)  nfeccin vaginal por hongos (candidiasis):  Gyne-lotrimin 7  Monistat 7   **Si est tomando varias medicinas, por favor revise las etiquetas para Art gallery manager los mismos ingredientes [place]. **Tome la medicina segn lo indicado en la etiqueta. **No tome ms de 400 mg de Tylenol en 24 horas. **No tome medicinas que contengan aspirina o ibuprofeno.

## 2017-10-03 NOTE — MAU Note (Signed)
2 years ago she experienced a positive urine pregnancy test, sac was seen on ultrasound, but no baby.  She has another positive pregnancy test today, has had some spotting and is worried about the status of this pregnancy.

## 2017-10-04 LAB — RH IG WORKUP (INCLUDES ABO/RH)
ABO/RH(D): B NEG
Antibody Screen: NEGATIVE
Gestational Age(Wks): 8
UNIT DIVISION: 0
Unit division: 0

## 2017-10-04 LAB — GC/CHLAMYDIA PROBE AMP (~~LOC~~) NOT AT ARMC
Chlamydia: NEGATIVE
NEISSERIA GONORRHEA: NEGATIVE

## 2017-10-07 ENCOUNTER — Inpatient Hospital Stay (HOSPITAL_COMMUNITY)
Admission: AD | Admit: 2017-10-07 | Discharge: 2017-10-07 | Disposition: A | Discharge status: Home / Self Care | Payer: No Typology Code available for payment source | Source: Ambulatory Visit | Attending: Obstetrics & Gynecology | Admitting: Obstetrics & Gynecology

## 2017-10-07 DIAGNOSIS — O039 Complete or unspecified spontaneous abortion without complication: Secondary | ICD-10-CM | POA: Insufficient documentation

## 2017-10-07 LAB — HCG, QUANTITATIVE, PREGNANCY: HCG, BETA CHAIN, QUANT, S: 416 m[IU]/mL — AB (ref <5)

## 2017-10-07 NOTE — MAU Note (Signed)
Pt states she was here last week with spotting , positive sac, no baby. Scheduled u/s in 2 weeks. States she continued to bleed and having cramping. On  Saturday she passed something. Has continued to have bleeding, sore but no pain.

## 2017-10-07 NOTE — Discharge Instructions (Signed)
Aborto espontáneo °(Miscarriage) °El aborto espontáneo es la pérdida de un bebé que no ha nacido.(feto) antes de la semana 20 del embarazo. La causa generalmente es desconocida. °CUIDADOS EN EL HOGAR °· Debe permanecer en cama (reposo en cama) o podrá hacer actividades livianas. Regrese a sus actividades según las indicaciones del médico. °· Pida ayuda con las tareas domésticas. °· Anote cuántos apósitos usa por día. Describa el grado en que están empapados. °· No use tampones. No se higienice la vagina (duchas vaginales) ni tenga relaciones sexuales (coito) hasta que el médico la autorice. °· Sólo debe tomar la medicación según las indicaciones del médico. °· No tome aspirina. °· Cumpla con los controles médicos según las indicaciones. °· Si usted o su pareja tienen problemas con el duelo, hable con su médico. También puede intentar con psicoterapia. Permítase el tiempo suficiente de duelo antes de quedar embarazada nuevamente. ° °SOLICITE AYUDA DE INMEDIATO SI: °· Siente cólicos intensos o dolor en el estómago, en la espalda o en el vientre (abdomen). °· Tiene fiebre. °· Elimina grumos de sangre (coágulos) por la vagina, que tienen el tamaño de una nuez o más. Guarde los coágulos para que el médico los vea. °· Elimina gran cantidad de tejidos por la vagina. Guarde lo que ha eliminado para que su médico lo examine. °· Aumenta el sangrado. °· Observa una secreción espesa, con mal olor (pérdida) que proviene de la vagina. °· Se siente mareada, débil o se desvanece (se desmaya). °· Siente escalofríos. ° °ASEGÚRESE DE QUE: °· Comprende estas instrucciones. °· Controlará su enfermedad. °· Solicitará ayuda de inmediato si no mejora o si empeora. ° °Esta información no tiene como fin reemplazar el consejo del médico. Asegúrese de hacerle al médico cualquier pregunta que tenga. °Document Released: 11/06/2011 Document Revised: 11/06/2011 Document Reviewed: 06/07/2011 °Elsevier Interactive Patient Education © 2017 Elsevier  Inc. ° °

## 2017-10-07 NOTE — MAU Provider Note (Signed)
Chief Complaint: Abdominal Pain and Vaginal Bleeding   First Provider Initiated Contact with Patient 10/07/17 2051      SUBJECTIVE HPI: Maria [name] is a 37 y.o. [id number] at [redacted]w[redacted]d by LMP who presents to maternity admissions reporting abdominal pain and vaginal bleeding. She was seen in MAU 4 days ago on 5/16 for the same concerns and was noted to have a gestational sac and yolk sac. She reports continued vaginal bleeding and abdominal pain since being discharged- no increase of pain or change of pain. She reports passing a clear sac at home and believes to be having a miscarriage. She declines going to a room for a full exam due to not having insurance and just want to know whether she miscarried or not.   Past Medical History:  Diagnosis Date  . Abnormal Pap smear   . Blood type, Rh negative   . Gestational diabetes mellitus in pregnancy, diet-controlled   . Mucocele of salivary gland   . Rh incompatibility    Past Surgical History:  Procedure Laterality Date  . CESAREAN SECTION  05/27/07   CPD   Social History   Socioeconomic History  . Marital status: Married    Spouse name: Roxan Hockey  . Number of children: 2  . Years of education: 9  . Highest education level: Not on file  Occupational History    Employer: UNEMPLOYED  Social Needs  . Financial resource strain: Not on file  . Food insecurity:    Worry: Not on file    Inability: Not on file  . Transportation needs:    Medical: Not on file    Non-medical: Not on file  Tobacco Use  . Smoking status: Never Smoker  . Smokeless tobacco: Never Used  Substance and Sexual Activity  . Alcohol use: No  . Drug use: No  . Sexual activity: Not Currently    Partners: Male    Birth control/protection: None  Lifestyle  . Physical activity:    Days per week: Not on file    Minutes per session: Not on file  . Stress: Not on file  Relationships  . Social connections:    Talks on phone: Not on file    Gets together: Not on  file    Attends religious service: Not on file    Active member of club or organization: Not on file    Attends meetings of clubs or organizations: Not on file    Relationship status: Not on file  . Intimate partner violence:    Fear of current or ex partner: Not on file    Emotionally abused: Not on file    Physically abused: Not on file    Forced sexual activity: Not on file  Other Topics Concern  . Not on file  Social History Narrative   Lives in [place] with husband, Roxan Hockey   Son, [place], [place] 05/27/07   Son, Francesca Jewett, DOB 12/24/2010   No current facility-administered medications on file prior to encounter.    Current Outpatient Medications on File Prior to Encounter  Medication Sig Dispense Refill  . Prenatal Vit-Fe Fumarate-FA (MULTIVITAMIN-PRENATAL) 27-0.8 MG TABS tablet Take 1 tablet by mouth daily at 12 noon.     No Known Allergies  ROS:  Review of Systems  Gastrointestinal: Positive for abdominal pain. Negative for constipation, diarrhea, nausea and vomiting.  Genitourinary: Positive for vaginal bleeding. Negative for difficulty urinating, dysuria, frequency and urgency.   I have reviewed patient's Past Medical  Hx, Surgical Hx, Family Hx, Social Hx, medications and allergies.   Physical Exam   Patient Vitals for the past 24 hrs:  BP Temp Temp src Pulse Resp SpO2 Height Weight  10/07/17 2112 111/64 - - 69 17 - - -  10/07/17 1835 111/72 98.7 F (37.1 C) Oral 68 16 99 %  (1.626 m) 151 lb (68.5 kg)   Constitutional: Well-developed, well-nourished female in no acute distress.  Cardiovascular: normal rate Respiratory: normal effort GI: Abd soft, non-tender. Pos BS x 4 Neurologic: Alert and oriented x 4.   LAB RESULTS Results for orders placed or performed during the hospital encounter of 10/07/17 (from the past 24 hour(s))  hCG, quantitative, pregnancy     Status: Abnormal   Collection Time: 10/07/17  6:52 PM  Result Value Ref Range    hCG, Beta Chain, Quant, S 416 (H) <5 mIU/mL    --/--/B NEG (05/16 1556)  MAU Management/MDM: Orders Placed This Encounter  Procedures  . hCG, quantitative, pregnancy  . Discharge patient Discharge disposition: 01-Home or Self Care; Discharge patient date: 10/07/2017   Discussed with patient results of HCG with a significant drop from 4,535 on 5/16 to 416 today. Educated on miscarriage with follow up in the office. Discussed with patient options of lab work and testing at a later date to determine possible cause of 5 miscarriages. Patient verbalizes understanding. Spanish interpreter at bedside through assessment and discharge.    Pt discharged. Pt stable at time of discharge.   ASSESSMENT 1. Spontaneous miscarriage     PLAN Discharge home Follow up as scheduled in the office in 1 week for repeat lab work and 2 weeks with a provider.   Follow-up Information    [facility]. Schedule an appointment as soon as possible for a visit on 10/15/2017.   Specialty:  Obstetrics and Gynecology Why:  Go to appointment in one week for repeat lab work and provider in 2 weeks  Contact information: [address] [place] [address] [phone]          Allergies as of 10/07/2017   No Known Allergies     Medication List    TAKE these medications   multivitamin-prenatal 27-0.8 MG Tabs tablet Take 1 tablet by mouth daily at 12 noon.       Steward Drone  Certified Nurse-Midwife 10/07/2017  8:53 PM

## 2017-10-08 ENCOUNTER — Telehealth: Payer: Self-pay | Admitting: General Practice

## 2017-10-08 NOTE — Telephone Encounter (Signed)
Called patient (with interpreter) to schedule lab visit and gyn visit for SAB follow up.  Patient voiced understanding.

## 2017-10-11 ENCOUNTER — Other Ambulatory Visit: Payer: Self-pay | Admitting: *Deleted

## 2017-10-11 DIAGNOSIS — O039 Complete or unspecified spontaneous abortion without complication: Secondary | ICD-10-CM

## 2017-10-15 ENCOUNTER — Other Ambulatory Visit: Payer: No Typology Code available for payment source

## 2017-10-15 ENCOUNTER — Other Ambulatory Visit: Payer: Self-pay | Admitting: Certified Nurse Midwife

## 2017-10-15 DIAGNOSIS — O039 Complete or unspecified spontaneous abortion without complication: Secondary | ICD-10-CM

## 2017-10-16 ENCOUNTER — Ambulatory Visit (HOSPITAL_COMMUNITY): Payer: Self-pay

## 2017-10-16 LAB — BETA HCG QUANT (REF LAB): hCG Quant: 12 m[IU]/mL

## 2017-10-22 ENCOUNTER — Ambulatory Visit (INDEPENDENT_AMBULATORY_CARE_PROVIDER_SITE_OTHER): Payer: Self-pay | Admitting: Advanced Practice Midwife

## 2017-10-22 ENCOUNTER — Encounter: Payer: Self-pay | Admitting: Advanced Practice Midwife

## 2017-10-22 VITALS — BP 130/74 | HR 70 | Wt 150.0 lb

## 2017-10-22 DIAGNOSIS — O039 Complete or unspecified spontaneous abortion without complication: Secondary | ICD-10-CM

## 2017-10-22 DIAGNOSIS — Z3009 Encounter for other general counseling and advice on contraception: Secondary | ICD-10-CM

## 2017-10-22 MED ORDER — NORGESTIMATE-ETH ESTRADIOL 0.25-35 MG-MCG PO TABS: 1.0000 | ORAL_TABLET | Freq: Every day | ORAL | 11 refills | Status: AC

## 2017-10-22 NOTE — Progress Notes (Signed)
  Subjective:     Patient ID: Maria [name], female   DOB: 01-16-1981, 37 y.o.   MRN: [id number]  Maria [name] is a 37 y.o. [id number] who is here today for SAB follow up. She had a confirmed SAB on 10/07/17. HCG on 10/03/17 was 4535, on 10/07/17 it was 416 and on 10/15/17 it was 12. She denies any pain or bleeding today. She is interested in birth control today. She has had multiple miscarriages, and is worried about getting pregnant again. She had a work up done in 2017, and all results were normal at that time. No explanation for recurrent SABs.     Review of Systems  All other systems reviewed and are negative.      Objective:   Physical Exam  Constitutional: She is oriented to person, place, and time. She appears well-developed and well-nourished. She appears distressed (tearful ).  HENT:  Head: Normocephalic.  Cardiovascular: Normal rate.  Pulmonary/Chest: Effort normal.  Musculoskeletal: Normal range of motion.  Neurological: She is alert and oriented to person, place, and time.  Psychiatric: She has a normal mood and affect.  Nursing note and vitals reviewed.  Reviewed with the patient all forms of birth control options available including abstinence; over the counter/barrier methods; hormonal contraceptive medication including pill, patch, ring, Depo-Provera injection, Nexplanon; Mirena/Liletta and Paragard IUDs; permanent sterilization options including vasectomy, tubal ligation (laparoscopic and hysteroscopic/Essure). Risks and benefits reviewed.  Questions were answered.  Information was given to patient to review.      Assessment:     1. SAB (spontaneous abortion)   2. General counselling and advice on contraception       Plan:     RX: sprintec 28 #1 with 11RF Patient given phone number for Dr. April MansonYalcinkaya She is not ready to call him now, but may in the future. If she calls and needs a referral she will let us know.     Thressa ShellerHeather Brenin Heidelberger 5:20 PM 10/22/17

## 2017-10-22 NOTE — Patient Instructions (Addendum)
Dr. April MansonYalcinkaya [address] [place] [id number] Phone: [phone]   Uso de los anticonceptivos orales (Oral Contraception Use) Los anticonceptivos orales (ACO) son medicamentos que se utilizan para Location managerevitar el embarazo. Su funcin es ALLTEL Corporationevitar que los ovarios liberen vulos. Las hormonas de los ACO tambin hacen que el moco cervical se haga ms espeso, lo que evita que el esperma ingrese al tero. Tambin hacen que la membrana que recubre internamente al tero se vuelva ms fina, lo que no permite que el huevo fertilizado se adhiera a la pared del tero. Los ACO son muy efectivos cuando se toman exactamente como se prescriben. Sin embargo, no previenen contra las enfermedades de transmisin sexual (ETS). La prctica del sexo seguro, como el uso de preservativos, junto con los ACO, [place]udan a prevenir ese tipo de enfermedades. Antes de tomar ACO, debe hacerse un examen fsico y un Papanicolau. El mdico podr indicarle anlisis de [place], si es necesario. El mdico se asegurar de que usted sea [place] buena candidata para usar anticonceptivos orales. Converse con su mdico acerca de los posibles efectos secundarios de los ACO que podran recetarle. Cuando se inicia el uso de ACO, se pueden tomar durante 2 a 3 meses para que el cuerpo se adapte a los cambios en los niveles hormonales en el cuerpo. CMO TOMAR LOS ANTICONCEPTIVOS ORALES El mdico le indicar como comenzar a Building services engineertomar el primer ciclo de ACO. De lo contrario usted puede:  Engineering geologistComenzar el da de inicio del ciclo menstrual. No necesitar proteccin anticonceptiva adicional al Investment banker, operationalcomenzar en este momento.  Comenzar Financial risk analystel primer domingo luego de su perodo menstrual, o Medical laboratory scientific officerel da en que adquiere el Automatic Datamedicamento. En estos casos deber EchoStartener proteccin anticonceptiva The TJX Companiesadicional durante los primeros 7 das del Trilbyciclo.  Comenzar a tomarlos en cualquier momento del ciclo. Si toma el anticonceptivo dentro de los [address] das de iniciado el perodo, Theme park managerestar protegida de quedar  embarazada inmediatamente. En este caso, no necesitar una forma adicional de anticonceptivos. Si comienza en cualquier otro momento del ciclo menstrual, necesitar usar otra forma de anticonceptivo durante 7 [address]  [address]. Si sus ACO son del tipo de los Citigroupllamados minipldoras, podrn impedir el embarazo despus de tomarlas por 2 das (48 horas). Luego de comenzar a tomar los ACO:  Si olvid de tomar 1 pldora, tmela tan pronto como lo recuerde. Tome la siguiente pldora a la hora habitual.  Si dej de tomar 2 o ms pldoras, comunquese con su mdico ya que diferentes pldoras tienen diferentes instrucciones para las dosis que no se han tomado. Si olvida tomar 2 o ms pldoras, utilice un mtodo anticonceptivo adicional hasta que comience su prximo perodo menstrual.  Si utiliza el envase de 28 pldoras que contienen pldoras inactivas y Venezuelaolvida tomar 1 de las ltimas 7 (pldoras sin hormonas), sto no tiene Quarry managerimportancia. Simplemente deseche el resto de las pldoras que no contienen hormonas y comience un nuevo envase. No importa cuando comience a tomar los anticonceptivos, siempre empiece un nuevo envase el mismo da de la Nebosemana. Tenga un envase extra de ACO y use un mtodo anticonceptivo adicional para Restaurant manager, fast foodel caso en que se olvide de tomar algunas pldoras o pierda la caja. INSTRUCCIONES PARA EL CUIDADO EN EL HOGAR  No fume.  Use siempre un condn para protegerse contra las enfermedades de transmisin sexual. Los ACO no protegen contra las enfermedades de transmisin sexual.  Use un almanaque para Thrivent Financialmarcar los das de su perodo menstrual.  Lea la informacin y consejos que vienen con las ACO. Hable con el  profesional si tiene dudas.  SOLICITE ATENCIN MDICA SI:  Presenta nuseas o vmitos.  Tiene flujo o sangrado vaginal anormal.  Aparece una erupcin cutnea.  No tiene el perodo menstrual.  Pierde el cabello.  Necesita tratamiento por cambios en su estado de nimo o por depresin.  Se siente  mareada al [employer].  Comienza a aparecer acn con el uso de los ACO.  Ardelle Anton.  SOLICITE ATENCIN MDICA DE INMEDIATO SI:  Siente dolor en el pecho.  Le falta el aire.  Le duele mucho la cabeza y no puede Human resources officer.  Siente adormecimiento o tiene dificultad para hablar.  Tiene problemas de visin.  Presenta dolor, inflamacin o hinchazn en las piernas.  Esta informacin no tiene Theme park manager el consejo del mdico. Asegrese de hacerle al mdico cualquier pregunta que tenga. Document Released: 04/26/2011 Document Revised: 08/29/2015 Document Reviewed: 10/26/2012 Elsevier Interactive Patient Education  2017 [employer].

## 2018-08-06 ENCOUNTER — Encounter (HOSPITAL_COMMUNITY): Payer: Self-pay

## 2021-02-06 ENCOUNTER — Other Ambulatory Visit: Payer: Self-pay

## 2021-02-06 ENCOUNTER — Ambulatory Visit: Payer: Self-pay | Attending: Internal Medicine | Admitting: Internal Medicine

## 2021-02-06 DIAGNOSIS — Z91199 Patient's noncompliance with other medical treatment and regimen due to unspecified reason: Secondary | ICD-10-CM

## 2021-02-06 DIAGNOSIS — Z5329 Procedure and treatment not carried out because of patient's decision for other reasons: Secondary | ICD-10-CM

## 2021-02-06 NOTE — Progress Notes (Signed)
Patient no showed for telephone visit. Phone call was placed to patient at 9:28 AM and 9:30 AM using [facility] interpreters Ricki Rodriguez [phone].  Left message informing her that I was calling to do her telephone visit and that she can give [place] a call back to reschedule.

## 2024-01-01 ENCOUNTER — Other Ambulatory Visit: Payer: Self-pay | Admitting: Obstetrics & Gynecology

## 2024-01-01 DIAGNOSIS — Z1231 Encounter for screening mammogram for malignant neoplasm of breast: Secondary | ICD-10-CM

## 2024-01-23 ENCOUNTER — Ambulatory Visit
Admission: RE | Admit: 2024-01-23 | Discharge: 2024-01-23 | Disposition: A | Discharge status: Home / Self Care | Payer: Self-pay | Source: Ambulatory Visit | Attending: Obstetrics & Gynecology | Admitting: Obstetrics & Gynecology

## 2024-01-23 DIAGNOSIS — Z1231 Encounter for screening mammogram for malignant neoplasm of breast: Secondary | ICD-10-CM

## 2024-01-29 ENCOUNTER — Ambulatory Visit: Payer: Self-pay | Admitting: Obstetrics & Gynecology
